# Patient Record
Sex: Male | Born: 1965
Health system: Southern US, Community
[De-identification: ages and names within clinical notes are randomized; demographics above are authoritative.]

---

## 2004-04-11 ENCOUNTER — Emergency Department: Payer: Self-pay | Admitting: Emergency Medicine

## 2004-04-12 ENCOUNTER — Other Ambulatory Visit: Payer: Self-pay

## 2011-11-09 ENCOUNTER — Emergency Department: Payer: Self-pay | Admitting: Emergency Medicine

## 2011-11-09 LAB — CBC
HCT: 47.2 % (ref 40.0–52.0)
MCHC: 33.6 g/dL (ref 32.0–36.0)
MCV: 94 fL (ref 80–100)
Platelet: 300 10*3/uL (ref 150–440)
RBC: 5.03 10*6/uL (ref 4.40–5.90)
WBC: 10.2 10*3/uL (ref 3.8–10.6)

## 2011-11-09 LAB — URINALYSIS, COMPLETE
Bilirubin,UR: NEGATIVE
Blood: NEGATIVE
Glucose,UR: NEGATIVE mg/dL (ref 0–75)
Ketone: NEGATIVE
Ph: 5 (ref 4.5–8.0)
RBC,UR: NONE SEEN /HPF (ref 0–5)
Squamous Epithelial: NONE SEEN

## 2011-11-09 LAB — COMPREHENSIVE METABOLIC PANEL
Alkaline Phosphatase: 102 U/L (ref 50–136)
Anion Gap: 5 — ABNORMAL LOW (ref 7–16)
BUN: 12 mg/dL (ref 7–18)
Bilirubin,Total: 0.3 mg/dL (ref 0.2–1.0)
Calcium, Total: 9.2 mg/dL (ref 8.5–10.1)
Chloride: 104 mmol/L (ref 98–107)
Creatinine: 0.99 mg/dL (ref 0.60–1.30)
EGFR (African American): >60
SGOT(AST): 34 U/L (ref 15–37)

## 2011-11-10 ENCOUNTER — Ambulatory Visit: Payer: Self-pay | Admitting: Internal Medicine

## 2011-11-12 ENCOUNTER — Ambulatory Visit: Payer: Self-pay | Admitting: Oncology

## 2011-11-12 LAB — COMPREHENSIVE METABOLIC PANEL
Albumin: 3.7 g/dL (ref 3.4–5.0)
Alkaline Phosphatase: 79 U/L (ref 50–136)
Anion Gap: 6 — ABNORMAL LOW (ref 7–16)
BUN: 14 mg/dL (ref 7–18)
Bilirubin,Total: 0.4 mg/dL (ref 0.2–1.0)
Calcium, Total: 9 mg/dL (ref 8.5–10.1)
Chloride: 107 mmol/L (ref 98–107)
Co2: 27 mmol/L (ref 21–32)
Creatinine: 1.07 mg/dL (ref 0.60–1.30)
EGFR (Non-African Amer.): >60
Glucose: 127 mg/dL — ABNORMAL HIGH (ref 65–99)
SGOT(AST): 22 U/L (ref 15–37)
SGPT (ALT): 32 U/L
Sodium: 140 mmol/L (ref 136–145)

## 2011-11-12 LAB — CBC CANCER CENTER
Basophil #: 0.1 x10 3/mm (ref 0.0–0.1)
Basophil %: 0.6 %
Eosinophil #: 0 x10 3/mm (ref 0.0–0.7)
HGB: 15.1 g/dL (ref 13.0–18.0)
Lymphocyte #: 1.8 x10 3/mm (ref 1.0–3.6)
Lymphocyte %: 16.8 %
MCHC: 33.9 g/dL (ref 32.0–36.0)
MCV: 94 fL (ref 80–100)
Monocyte #: 0.5 x10 3/mm (ref 0.2–1.0)
Neutrophil #: 8.1 x10 3/mm — ABNORMAL HIGH (ref 1.4–6.5)
Neutrophil %: 77.8 %
RBC: 4.75 10*6/uL (ref 4.40–5.90)
WBC: 10.5 x10 3/mm (ref 3.8–10.6)

## 2011-11-12 LAB — APTT: Activated PTT: 27 secs (ref 23.6–35.9)

## 2011-11-16 ENCOUNTER — Ambulatory Visit: Payer: Self-pay | Admitting: Oncology

## 2011-11-22 ENCOUNTER — Ambulatory Visit: Payer: Self-pay | Admitting: Oncology

## 2011-11-26 ENCOUNTER — Ambulatory Visit: Payer: Self-pay | Admitting: Oncology

## 2011-11-30 LAB — PATHOLOGY REPORT

## 2011-12-02 DIAGNOSIS — Z01810 Encounter for preprocedural cardiovascular examination: Secondary | ICD-10-CM

## 2011-12-07 LAB — CBC
Platelet: 270 10*3/uL (ref 150–440)
RBC: 4.7 10*6/uL (ref 4.40–5.90)
WBC: 15.7 10*3/uL — ABNORMAL HIGH (ref 3.8–10.6)

## 2011-12-07 LAB — COMPREHENSIVE METABOLIC PANEL
BUN: 15 mg/dL (ref 7–18)
Bilirubin,Total: 0.4 mg/dL (ref 0.2–1.0)
Chloride: 106 mmol/L (ref 98–107)
Co2: 24 mmol/L (ref 21–32)
Creatinine: 1.05 mg/dL (ref 0.60–1.30)
EGFR (Non-African Amer.): >60
Glucose: 114 mg/dL — ABNORMAL HIGH (ref 65–99)
SGPT (ALT): 29 U/L

## 2011-12-08 ENCOUNTER — Inpatient Hospital Stay: Payer: Self-pay | Admitting: Internal Medicine

## 2011-12-08 DIAGNOSIS — I517 Cardiomegaly: Secondary | ICD-10-CM

## 2011-12-08 LAB — URINALYSIS, COMPLETE
Bilirubin,UR: NEGATIVE
Ketone: NEGATIVE
Leukocyte Esterase: NEGATIVE
Nitrite: NEGATIVE
Ph: 5 (ref 4.5–8.0)
RBC,UR: <1 /HPF (ref 0–5)
Specific Gravity: 1.036 (ref 1.003–1.030)
WBC UR: <1 /HPF (ref 0–5)

## 2011-12-08 LAB — CK-MB: CK-MB: <0.5 ng/mL — ABNORMAL LOW (ref 0.5–3.6)

## 2011-12-08 LAB — TROPONIN I
Troponin-I: <0.02 ng/mL
Troponin-I: <0.02 ng/mL

## 2011-12-09 LAB — CBC WITH DIFFERENTIAL/PLATELET
Basophil #: 0 10*3/uL (ref 0.0–0.1)
Basophil %: 0.1 %
Eosinophil #: 0 10*3/uL (ref 0.0–0.7)
Eosinophil %: 0 %
HCT: 41.7 % (ref 40.0–52.0)
HGB: 13.8 g/dL (ref 13.0–18.0)
Lymphocyte #: 1.2 10*3/uL (ref 1.0–3.6)
MCH: 31.2 pg (ref 26.0–34.0)
MCHC: 33.1 g/dL (ref 32.0–36.0)
Monocyte #: 1.6 x10 3/mm — ABNORMAL HIGH (ref 0.2–1.0)
Monocyte %: 6 %
Neutrophil %: 89.2 %
RDW: 12.8 % (ref 11.5–14.5)

## 2011-12-09 LAB — COMPREHENSIVE METABOLIC PANEL
Albumin: 2.8 g/dL — ABNORMAL LOW (ref 3.4–5.0)
Anion Gap: 9 (ref 7–16)
BUN: 14 mg/dL (ref 7–18)
Bilirubin,Total: 0.3 mg/dL (ref 0.2–1.0)
Calcium, Total: 9.1 mg/dL (ref 8.5–10.1)
Co2: 25 mmol/L (ref 21–32)
Creatinine: 0.85 mg/dL (ref 0.60–1.30)
EGFR (African American): >60
Osmolality: 282 (ref 275–301)
SGPT (ALT): 19 U/L
Sodium: 140 mmol/L (ref 136–145)
Total Protein: 7.5 g/dL (ref 6.4–8.2)

## 2011-12-09 LAB — FOLATE: Folic Acid: 9.5 ng/mL (ref 3.1–100.0)

## 2011-12-09 LAB — PROTIME-INR
INR: 1
Prothrombin Time: 13.3 secs (ref 11.5–14.7)

## 2011-12-09 LAB — MAGNESIUM: Magnesium: 1.9 mg/dL

## 2011-12-10 LAB — CBC WITH DIFFERENTIAL/PLATELET
Basophil #: 0 10*3/uL (ref 0.0–0.1)
Basophil %: 0.1 %
Eosinophil #: 0 10*3/uL (ref 0.0–0.7)
Eosinophil %: 0 %
HGB: 13.2 g/dL (ref 13.0–18.0)
Lymphocyte #: 1.2 10*3/uL (ref 1.0–3.6)
MCHC: 34.2 g/dL (ref 32.0–36.0)
Monocyte #: 1.8 x10 3/mm — ABNORMAL HIGH (ref 0.2–1.0)
Neutrophil %: 86.6 %
Platelet: 285 10*3/uL (ref 150–440)
RBC: 4.12 10*6/uL — ABNORMAL LOW (ref 4.40–5.90)
RDW: 12.9 % (ref 11.5–14.5)
WBC: 22.9 10*3/uL — ABNORMAL HIGH (ref 3.8–10.6)

## 2011-12-11 ENCOUNTER — Ambulatory Visit: Payer: Self-pay | Admitting: Internal Medicine

## 2011-12-11 ENCOUNTER — Ambulatory Visit: Payer: Self-pay | Admitting: Oncology

## 2011-12-11 LAB — CBC WITH DIFFERENTIAL/PLATELET
Eosinophil #: 0 10*3/uL (ref 0.0–0.7)
Eosinophil %: 0.5 %
HCT: 42.4 % (ref 40.0–52.0)
HGB: 14.4 g/dL (ref 13.0–18.0)
Lymphocyte #: 2.1 10*3/uL (ref 1.0–3.6)
Lymphocyte %: 22.4 %
MCH: 32.1 pg (ref 26.0–34.0)
MCHC: 33.8 g/dL (ref 32.0–36.0)
MCV: 95 fL (ref 80–100)
Monocyte #: 1.1 x10 3/mm — ABNORMAL HIGH (ref 0.2–1.0)
Neutrophil %: 64.7 %
RBC: 4.48 10*6/uL (ref 4.40–5.90)

## 2011-12-13 LAB — CULTURE, BLOOD (SINGLE)

## 2011-12-14 LAB — COMPREHENSIVE METABOLIC PANEL
Alkaline Phosphatase: 198 U/L — ABNORMAL HIGH (ref 50–136)
Anion Gap: 8 (ref 7–16)
Calcium, Total: 9.3 mg/dL (ref 8.5–10.1)
Chloride: 101 mmol/L (ref 98–107)
Co2: 28 mmol/L (ref 21–32)
Creatinine: 1.04 mg/dL (ref 0.60–1.30)
EGFR (African American): >60
Glucose: 120 mg/dL — ABNORMAL HIGH (ref 65–99)
Osmolality: 277 (ref 275–301)
Potassium: 4.5 mmol/L (ref 3.5–5.1)
SGPT (ALT): 43 U/L
Sodium: 137 mmol/L (ref 136–145)

## 2011-12-14 LAB — CBC CANCER CENTER
Eosinophil: 2 %
HGB: 15.5 g/dL (ref 13.0–18.0)
Lymphocytes: 15 %
MCH: 31.4 pg (ref 26.0–34.0)
MCV: 94 fL (ref 80–100)
Platelet: 370 x10 3/mm (ref 150–440)
Segmented Neutrophils: 71 %

## 2011-12-21 LAB — COMPREHENSIVE METABOLIC PANEL
Albumin: 3 g/dL — ABNORMAL LOW (ref 3.4–5.0)
BUN: 18 mg/dL (ref 7–18)
Calcium, Total: 9.2 mg/dL (ref 8.5–10.1)
Chloride: 103 mmol/L (ref 98–107)
Co2: 28 mmol/L (ref 21–32)
Creatinine: 0.94 mg/dL (ref 0.60–1.30)
Glucose: 104 mg/dL — ABNORMAL HIGH (ref 65–99)
Osmolality: 280 (ref 275–301)
SGOT(AST): 19 U/L (ref 15–37)
Sodium: 139 mmol/L (ref 136–145)

## 2011-12-21 LAB — CBC CANCER CENTER
Basophil %: 1 %
HCT: 43.3 % (ref 40.0–52.0)
HGB: 14.5 g/dL (ref 13.0–18.0)
Lymphocyte %: 10.5 %
MCH: 31.2 pg (ref 26.0–34.0)
MCHC: 33.4 g/dL (ref 32.0–36.0)
MCV: 94 fL (ref 80–100)
Monocyte #: 0.6 x10 3/mm (ref 0.2–1.0)
Neutrophil #: 14.7 x10 3/mm — ABNORMAL HIGH (ref 1.4–6.5)
Platelet: 329 x10 3/mm (ref 150–440)
RBC: 4.63 10*6/uL (ref 4.40–5.90)

## 2011-12-27 ENCOUNTER — Ambulatory Visit: Payer: Self-pay

## 2011-12-28 LAB — CBC CANCER CENTER
Basophil #: 0.1 x10 3/mm (ref 0.0–0.1)
Eosinophil #: 0 x10 3/mm (ref 0.0–0.7)
Eosinophil %: 0.5 %
HCT: 41.1 % (ref 40.0–52.0)
Lymphocyte #: 1.8 x10 3/mm (ref 1.0–3.6)
Lymphocyte %: 19.1 %
MCH: 31.5 pg (ref 26.0–34.0)
MCV: 93 fL (ref 80–100)
Monocyte #: 1.5 x10 3/mm — ABNORMAL HIGH (ref 0.2–1.0)
Monocyte %: 15.3 %
Neutrophil #: 6.1 x10 3/mm (ref 1.4–6.5)
Neutrophil %: 63.7 %
Platelet: 270 x10 3/mm (ref 150–440)
RBC: 4.42 10*6/uL (ref 4.40–5.90)
WBC: 9.5 x10 3/mm (ref 3.8–10.6)

## 2011-12-28 LAB — COMPREHENSIVE METABOLIC PANEL
Albumin: 3 g/dL — ABNORMAL LOW (ref 3.4–5.0)
Anion Gap: 6 — ABNORMAL LOW (ref 7–16)
Bilirubin,Total: 0.2 mg/dL (ref 0.2–1.0)
Calcium, Total: 8.8 mg/dL (ref 8.5–10.1)
Co2: 32 mmol/L (ref 21–32)
Creatinine: 1.27 mg/dL (ref 0.60–1.30)
EGFR (African American): >60
EGFR (Non-African Amer.): >60
Glucose: 115 mg/dL — ABNORMAL HIGH (ref 65–99)
Osmolality: 279 (ref 275–301)
Potassium: 4 mmol/L (ref 3.5–5.1)
SGOT(AST): 19 U/L (ref 15–37)
SGPT (ALT): 24 U/L
Sodium: 140 mmol/L (ref 136–145)
Total Protein: 7.6 g/dL (ref 6.4–8.2)

## 2012-01-04 LAB — CBC CANCER CENTER
Basophil %: 1.3 %
Eosinophil #: 0.1 x10 3/mm (ref 0.0–0.7)
HCT: 39.1 % — ABNORMAL LOW (ref 40.0–52.0)
HGB: 13 g/dL (ref 13.0–18.0)
Lymphocyte #: 3 x10 3/mm (ref 1.0–3.6)
Lymphocyte %: 18.4 %
MCH: 31 pg (ref 26.0–34.0)
MCHC: 33.3 g/dL (ref 32.0–36.0)
MCV: 93 fL (ref 80–100)
Monocyte #: 1.7 x10 3/mm — ABNORMAL HIGH (ref 0.2–1.0)
Monocyte %: 10 %
Platelet: 337 x10 3/mm (ref 150–440)
WBC: 16.6 x10 3/mm — ABNORMAL HIGH (ref 3.8–10.6)

## 2012-01-04 LAB — COMPREHENSIVE METABOLIC PANEL
Albumin: 2.9 g/dL — ABNORMAL LOW (ref 3.4–5.0)
BUN: 19 mg/dL — ABNORMAL HIGH (ref 7–18)
Bilirubin,Total: 0.2 mg/dL (ref 0.2–1.0)
Calcium, Total: 8.8 mg/dL (ref 8.5–10.1)
Creatinine: 1.22 mg/dL (ref 0.60–1.30)
EGFR (African American): >60
EGFR (Non-African Amer.): >60
Glucose: 133 mg/dL — ABNORMAL HIGH (ref 65–99)
Osmolality: 285 (ref 275–301)
Potassium: 4.3 mmol/L (ref 3.5–5.1)
Sodium: 141 mmol/L (ref 136–145)
Total Protein: 7.5 g/dL (ref 6.4–8.2)

## 2012-01-10 ENCOUNTER — Ambulatory Visit: Payer: Self-pay | Admitting: Oncology

## 2012-01-11 LAB — CBC CANCER CENTER
Basophil #: 0.3 x10 3/mm — ABNORMAL HIGH (ref 0.0–0.1)
Basophil %: 1.3 %
Eosinophil #: 0.1 x10 3/mm (ref 0.0–0.7)
HCT: 39.8 % — ABNORMAL LOW (ref 40.0–52.0)
HGB: 13.1 g/dL (ref 13.0–18.0)
Lymphocyte %: 13.4 %
MCV: 93 fL (ref 80–100)
Monocyte #: 1 x10 3/mm (ref 0.2–1.0)
Neutrophil %: 80.1 %
Platelet: 350 x10 3/mm (ref 150–440)
RDW: 13.2 % (ref 11.5–14.5)
WBC: 20.9 x10 3/mm — ABNORMAL HIGH (ref 3.8–10.6)

## 2012-01-25 LAB — CBC CANCER CENTER
Basophil #: 0.1 x10 3/mm (ref 0.0–0.1)
Basophil %: 1 %
Eosinophil #: 0.2 x10 3/mm (ref 0.0–0.7)
HCT: 38.4 % — ABNORMAL LOW (ref 40.0–52.0)
HGB: 12.8 g/dL — ABNORMAL LOW (ref 13.0–18.0)
Lymphocyte #: 3 x10 3/mm (ref 1.0–3.6)
MCH: 31 pg (ref 26.0–34.0)
MCHC: 33.3 g/dL (ref 32.0–36.0)
MCV: 93 fL (ref 80–100)
Monocyte #: 1.8 x10 3/mm — ABNORMAL HIGH (ref 0.2–1.0)
Neutrophil #: 9.6 x10 3/mm — ABNORMAL HIGH (ref 1.4–6.5)
RDW: 14.8 % — ABNORMAL HIGH (ref 11.5–14.5)
WBC: 14.8 x10 3/mm — ABNORMAL HIGH (ref 3.8–10.6)

## 2012-01-25 LAB — COMPREHENSIVE METABOLIC PANEL
Anion Gap: 9 (ref 7–16)
Bilirubin,Total: 0.2 mg/dL (ref 0.2–1.0)
Chloride: 104 mmol/L (ref 98–107)
Co2: 26 mmol/L (ref 21–32)
Creatinine: 1.03 mg/dL (ref 0.60–1.30)
Potassium: 4.3 mmol/L (ref 3.5–5.1)
SGPT (ALT): 20 U/L
Total Protein: 7.4 g/dL (ref 6.4–8.2)

## 2012-02-01 LAB — CBC CANCER CENTER
Basophil #: 0.2 x10 3/mm — ABNORMAL HIGH (ref 0.0–0.1)
Eosinophil #: 0.1 x10 3/mm (ref 0.0–0.7)
HCT: 39.3 % — ABNORMAL LOW (ref 40.0–52.0)
Lymphocyte #: 2.7 x10 3/mm (ref 1.0–3.6)
MCHC: 32.3 g/dL (ref 32.0–36.0)
MCV: 93 fL (ref 80–100)
Neutrophil #: 15.4 x10 3/mm — ABNORMAL HIGH (ref 1.4–6.5)
Platelet: 320 x10 3/mm (ref 150–440)
RDW: 14.2 % (ref 11.5–14.5)

## 2012-02-10 ENCOUNTER — Ambulatory Visit: Payer: Self-pay | Admitting: Oncology

## 2012-02-15 LAB — COMPREHENSIVE METABOLIC PANEL
Albumin: 3.2 g/dL — ABNORMAL LOW (ref 3.4–5.0)
Alkaline Phosphatase: 115 U/L (ref 50–136)
Anion Gap: 10 (ref 7–16)
BUN: 16 mg/dL (ref 7–18)
Creatinine: 1.26 mg/dL (ref 0.60–1.30)
Glucose: 108 mg/dL — ABNORMAL HIGH (ref 65–99)
Potassium: 4.4 mmol/L (ref 3.5–5.1)
SGOT(AST): 17 U/L (ref 15–37)
SGPT (ALT): 22 U/L (ref 12–78)
Total Protein: 7.3 g/dL (ref 6.4–8.2)

## 2012-02-15 LAB — CBC CANCER CENTER
Basophil #: 0.2 x10 3/mm — ABNORMAL HIGH (ref 0.0–0.1)
Eosinophil #: 0.3 x10 3/mm (ref 0.0–0.7)
HCT: 37.7 % — ABNORMAL LOW (ref 40.0–52.0)
Lymphocyte #: 2.7 x10 3/mm (ref 1.0–3.6)
Lymphocyte %: 24.1 %
MCH: 32.7 pg (ref 26.0–34.0)
MCHC: 35 g/dL (ref 32.0–36.0)
Monocyte #: 1.3 x10 3/mm — ABNORMAL HIGH (ref 0.2–1.0)
Neutrophil %: 60.7 %
Platelet: 315 x10 3/mm (ref 150–440)
RDW: 16 % — ABNORMAL HIGH (ref 11.5–14.5)

## 2012-02-22 LAB — CBC CANCER CENTER
Basophil #: 0.2 x10 3/mm — ABNORMAL HIGH (ref 0.0–0.1)
Eosinophil #: 0.2 x10 3/mm (ref 0.0–0.7)
Eosinophil %: 1 %
HGB: 12.1 g/dL — ABNORMAL LOW (ref 13.0–18.0)
Lymphocyte #: 2.5 x10 3/mm (ref 1.0–3.6)
Lymphocyte %: 15.1 %
MCH: 31.3 pg (ref 26.0–34.0)
MCHC: 32.8 g/dL (ref 32.0–36.0)
Monocyte #: 0.9 x10 3/mm (ref 0.2–1.0)
Neutrophil %: 77.6 %
Platelet: 215 x10 3/mm (ref 150–440)
RDW: 15.9 % — ABNORMAL HIGH (ref 11.5–14.5)

## 2012-03-12 ENCOUNTER — Ambulatory Visit: Payer: Self-pay | Admitting: Oncology

## 2012-03-14 LAB — CBC CANCER CENTER
Basophil #: 0.3 x10 3/mm — ABNORMAL HIGH (ref 0.0–0.1)
Eosinophil %: 1.5 %
HCT: 40.2 % (ref 40.0–52.0)
Lymphocyte #: 2.4 x10 3/mm (ref 1.0–3.6)
Lymphocyte %: 24.4 %
MCH: 32.7 pg (ref 26.0–34.0)
MCHC: 33.6 g/dL (ref 32.0–36.0)
MCV: 97 fL (ref 80–100)
Monocyte #: 1.5 x10 3/mm — ABNORMAL HIGH (ref 0.2–1.0)
Monocyte %: 15.5 %
Platelet: 344 x10 3/mm (ref 150–440)
RBC: 4.14 10*6/uL — ABNORMAL LOW (ref 4.40–5.90)
RDW: 16.4 % — ABNORMAL HIGH (ref 11.5–14.5)
WBC: 9.9 x10 3/mm (ref 3.8–10.6)

## 2012-03-14 LAB — COMPREHENSIVE METABOLIC PANEL
Anion Gap: 6 — ABNORMAL LOW (ref 7–16)
Calcium, Total: 9.2 mg/dL (ref 8.5–10.1)
Chloride: 102 mmol/L (ref 98–107)
Co2: 30 mmol/L (ref 21–32)
EGFR (African American): >60
Osmolality: 277 (ref 275–301)
Potassium: 4.2 mmol/L (ref 3.5–5.1)
Sodium: 138 mmol/L (ref 136–145)

## 2012-03-21 LAB — COMPREHENSIVE METABOLIC PANEL
Alkaline Phosphatase: 105 U/L (ref 50–136)
Anion Gap: 8 (ref 7–16)
Bilirubin,Total: 0.5 mg/dL (ref 0.2–1.0)
Calcium, Total: 9.2 mg/dL (ref 8.5–10.1)
Chloride: 107 mmol/L (ref 98–107)
Co2: 27 mmol/L (ref 21–32)
Creatinine: 1.18 mg/dL (ref 0.60–1.30)
Osmolality: 283 (ref 275–301)
Potassium: 3.9 mmol/L (ref 3.5–5.1)
Sodium: 142 mmol/L (ref 136–145)
Total Protein: 7.6 g/dL (ref 6.4–8.2)

## 2012-03-21 LAB — CBC CANCER CENTER
Eosinophil #: 0.2 x10 3/mm (ref 0.0–0.7)
Eosinophil %: 2.5 %
HCT: 38.5 % — ABNORMAL LOW (ref 40.0–52.0)
Lymphocyte #: 2.1 x10 3/mm (ref 1.0–3.6)
MCH: 33.6 pg (ref 26.0–34.0)
MCHC: 35.3 g/dL (ref 32.0–36.0)
MCV: 95 fL (ref 80–100)
Monocyte #: 1.2 x10 3/mm — ABNORMAL HIGH (ref 0.2–1.0)
Platelet: 261 x10 3/mm (ref 150–440)
RDW: 15.6 % — ABNORMAL HIGH (ref 11.5–14.5)
WBC: 9.2 x10 3/mm (ref 3.8–10.6)

## 2012-04-06 LAB — BASIC METABOLIC PANEL
BUN: 10 mg/dL (ref 7–18)
Calcium, Total: 8.9 mg/dL (ref 8.5–10.1)
Chloride: 102 mmol/L (ref 98–107)
Co2: 27 mmol/L (ref 21–32)
EGFR (African American): >60
EGFR (Non-African Amer.): >60
Glucose: 96 mg/dL (ref 65–99)
Osmolality: 271 (ref 275–301)
Potassium: 4.6 mmol/L (ref 3.5–5.1)
Sodium: 136 mmol/L (ref 136–145)

## 2012-04-11 ENCOUNTER — Ambulatory Visit: Payer: Self-pay | Admitting: Oncology

## 2012-04-11 LAB — CBC CANCER CENTER
Basophil %: 1.3 %
Eosinophil %: 1.6 %
HCT: 37.5 % — ABNORMAL LOW (ref 40.0–52.0)
HGB: 12.8 g/dL — ABNORMAL LOW (ref 13.0–18.0)
Lymphocyte #: 2.6 x10 3/mm (ref 1.0–3.6)
Lymphocyte %: 29.4 %
MCH: 33.3 pg (ref 26.0–34.0)
MCV: 98 fL (ref 80–100)
Monocyte #: 1.2 x10 3/mm — ABNORMAL HIGH (ref 0.2–1.0)
Neutrophil #: 4.8 x10 3/mm (ref 1.4–6.5)
Neutrophil %: 54.5 %
Platelet: 283 x10 3/mm (ref 150–440)
RBC: 3.83 10*6/uL — ABNORMAL LOW (ref 4.40–5.90)

## 2012-04-11 LAB — COMPREHENSIVE METABOLIC PANEL
Albumin: 3.4 g/dL (ref 3.4–5.0)
Anion Gap: 12 (ref 7–16)
Calcium, Total: 9 mg/dL (ref 8.5–10.1)
EGFR (African American): >60
EGFR (Non-African Amer.): >60
Glucose: 116 mg/dL — ABNORMAL HIGH (ref 65–99)
SGOT(AST): 19 U/L (ref 15–37)
Total Protein: 7.2 g/dL (ref 6.4–8.2)

## 2012-04-22 ENCOUNTER — Emergency Department: Payer: Self-pay | Admitting: Emergency Medicine

## 2012-04-22 LAB — CBC
HCT: 39.2 % — ABNORMAL LOW (ref 40.0–52.0)
HGB: 13.5 g/dL (ref 13.0–18.0)
MCH: 33.4 pg (ref 26.0–34.0)
MCHC: 34.4 g/dL (ref 32.0–36.0)
MCV: 97 fL (ref 80–100)
RBC: 4.04 10*6/uL — ABNORMAL LOW (ref 4.40–5.90)
RDW: 13.5 % (ref 11.5–14.5)
WBC: 7.8 10*3/uL (ref 3.8–10.6)

## 2012-04-23 LAB — BASIC METABOLIC PANEL
BUN: 15 mg/dL (ref 7–18)
Calcium, Total: 8.9 mg/dL (ref 8.5–10.1)
Chloride: 107 mmol/L (ref 98–107)
Co2: 26 mmol/L (ref 21–32)
EGFR (Non-African Amer.): >60
Glucose: 202 mg/dL — ABNORMAL HIGH (ref 65–99)
Osmolality: 286 (ref 275–301)
Potassium: 4.2 mmol/L (ref 3.5–5.1)

## 2012-04-23 LAB — TROPONIN I: Troponin-I: <0.02 ng/mL

## 2012-05-02 LAB — CBC CANCER CENTER
Basophil #: 0.1 10*3/uL
Basophil %: 0.9 %
Eosinophil #: 0.5 10*3/uL
Eosinophil %: 5.6 %
HCT: 39.9 % — ABNORMAL LOW
HGB: 13.1 g/dL
Lymphocyte %: 19.7 %
Lymphs Abs: 1.8 10*3/uL
MCH: 32.7 pg
MCHC: 32.9 g/dL
MCV: 99 fL
Monocyte #: 1 10*3/uL
Monocyte %: 11 %
Neutrophil #: 5.8 10*3/uL
Neutrophil %: 62.8 %
Platelet: 221 10*3/uL
RBC: 4.02 10*6/uL — ABNORMAL LOW
RDW: 13.3 %
WBC: 9.3 10*3/uL

## 2012-05-02 LAB — COMPREHENSIVE METABOLIC PANEL
Albumin: 3 g/dL — ABNORMAL LOW (ref 3.4–5.0)
Alkaline Phosphatase: 125 U/L (ref 50–136)
Bilirubin,Total: 0.3 mg/dL (ref 0.2–1.0)
Calcium, Total: 8.9 mg/dL (ref 8.5–10.1)
Chloride: 104 mmol/L (ref 98–107)
Co2: 24 mmol/L (ref 21–32)
Creatinine: 1.16 mg/dL (ref 0.60–1.30)
EGFR (African American): >60
EGFR (Non-African Amer.): >60
Glucose: 113 mg/dL — ABNORMAL HIGH (ref 65–99)
Osmolality: 280 (ref 275–301)
Sodium: 139 mmol/L (ref 136–145)
Total Protein: 6.9 g/dL (ref 6.4–8.2)

## 2012-05-09 ENCOUNTER — Inpatient Hospital Stay: Payer: Self-pay | Admitting: Surgery

## 2012-05-09 ENCOUNTER — Ambulatory Visit: Payer: Self-pay | Admitting: Oncology

## 2012-05-09 LAB — COMPREHENSIVE METABOLIC PANEL
Albumin: 3.3 g/dL — ABNORMAL LOW (ref 3.4–5.0)
Alkaline Phosphatase: 95 U/L (ref 50–136)
Bilirubin,Total: 0.5 mg/dL (ref 0.2–1.0)
Calcium, Total: 8.9 mg/dL (ref 8.5–10.1)
Chloride: 102 mmol/L (ref 98–107)
Creatinine: 1.2 mg/dL (ref 0.60–1.30)
EGFR (Non-African Amer.): >60
Glucose: 102 mg/dL — ABNORMAL HIGH (ref 65–99)
Osmolality: 276 (ref 275–301)
Sodium: 137 mmol/L (ref 136–145)

## 2012-05-09 LAB — CBC CANCER CENTER
Basophil %: 0.3 %
HCT: 39.9 % — ABNORMAL LOW (ref 40.0–52.0)
Lymphocyte #: 1.3 x10 3/mm (ref 1.0–3.6)
Lymphocyte %: 19.8 %
MCH: 33.2 pg (ref 26.0–34.0)
MCHC: 34.1 g/dL (ref 32.0–36.0)
Monocyte %: 1.4 %
Neutrophil #: 4.6 x10 3/mm (ref 1.4–6.5)
Neutrophil %: 72.1 %
Platelet: 115 x10 3/mm — ABNORMAL LOW (ref 150–440)
RBC: 4.1 10*6/uL — ABNORMAL LOW (ref 4.40–5.90)
RDW: 13.1 % (ref 11.5–14.5)

## 2012-05-09 LAB — LIPASE, BLOOD: Lipase: 117 U/L (ref 73–393)

## 2012-05-10 LAB — CBC WITH DIFFERENTIAL/PLATELET
Basophil #: 0 10*3/uL (ref 0.0–0.1)
Basophil %: 0.7 %
Eosinophil %: 2.6 %
HCT: 34.4 % — ABNORMAL LOW (ref 40.0–52.0)
HGB: 12.4 g/dL — ABNORMAL LOW (ref 13.0–18.0)
Lymphocyte #: 1.3 10*3/uL (ref 1.0–3.6)
MCH: 34 pg (ref 26.0–34.0)
MCHC: 36 g/dL (ref 32.0–36.0)
MCV: 95 fL (ref 80–100)
Monocyte #: 0.2 x10 3/mm (ref 0.2–1.0)
Neutrophil #: 4.8 10*3/uL (ref 1.4–6.5)
Neutrophil %: 74.7 %
RBC: 3.64 10*6/uL — ABNORMAL LOW (ref 4.40–5.90)

## 2012-05-10 LAB — BASIC METABOLIC PANEL
BUN: 16 mg/dL (ref 7–18)
Calcium, Total: 8.6 mg/dL (ref 8.5–10.1)
Co2: 27 mmol/L (ref 21–32)
Creatinine: 0.94 mg/dL (ref 0.60–1.30)
EGFR (African American): >60
EGFR (Non-African Amer.): >60
Glucose: 98 mg/dL (ref 65–99)

## 2012-05-11 LAB — CBC WITH DIFFERENTIAL/PLATELET
Basophil #: 0 10*3/uL (ref 0.0–0.1)
Basophil %: 0.6 %
Eosinophil #: 0 10*3/uL (ref 0.0–0.7)
HCT: 33.4 % — ABNORMAL LOW (ref 40.0–52.0)
HGB: 12.1 g/dL — ABNORMAL LOW (ref 13.0–18.0)
Lymphocyte #: 0.6 10*3/uL — ABNORMAL LOW (ref 1.0–3.6)
Lymphocyte %: 11.6 %
Monocyte #: 0.1 x10 3/mm — ABNORMAL LOW (ref 0.2–1.0)
Monocyte %: 2.5 %
Neutrophil #: 4.3 10*3/uL (ref 1.4–6.5)
Neutrophil %: 84.5 %
Platelet: 83 10*3/uL — ABNORMAL LOW (ref 150–440)
RDW: 12.8 % (ref 11.5–14.5)
WBC: 5.1 10*3/uL (ref 3.8–10.6)

## 2012-05-11 LAB — HEPATIC FUNCTION PANEL A (ARMC)
Albumin: 3.1 g/dL — ABNORMAL LOW (ref 3.4–5.0)
Alkaline Phosphatase: 79 U/L (ref 50–136)
Bilirubin, Direct: 0.1 mg/dL (ref 0.00–0.20)
Bilirubin,Total: 0.3 mg/dL (ref 0.2–1.0)

## 2012-05-12 ENCOUNTER — Ambulatory Visit: Payer: Self-pay | Admitting: Oncology

## 2012-05-12 LAB — CBC WITH DIFFERENTIAL/PLATELET
Basophil %: 0.5 %
Eosinophil #: 0.1 10*3/uL (ref 0.0–0.7)
Lymphocyte %: 12.7 %
MCH: 33 pg (ref 26.0–34.0)
MCHC: 34.6 g/dL (ref 32.0–36.0)
MCV: 95 fL (ref 80–100)
Monocyte #: 0.3 x10 3/mm (ref 0.2–1.0)
Neutrophil %: 81 %
Platelet: 77 10*3/uL — ABNORMAL LOW (ref 150–440)
RDW: 12.7 % (ref 11.5–14.5)

## 2012-05-15 LAB — PATHOLOGY REPORT

## 2012-05-16 LAB — CBC CANCER CENTER
Basophil #: 0 x10 3/mm (ref 0.0–0.1)
Basophil %: 1.4 %
Eosinophil #: 0.1 x10 3/mm (ref 0.0–0.7)
Eosinophil %: 3.5 %
HGB: 11.9 g/dL — ABNORMAL LOW (ref 13.0–18.0)
MCH: 33 pg (ref 26.0–34.0)
Monocyte #: 0.3 x10 3/mm (ref 0.2–1.0)
Monocyte %: 8.8 %
Neutrophil #: 1.7 x10 3/mm (ref 1.4–6.5)
Neutrophil %: 54.6 %
Platelet: 89 x10 3/mm — ABNORMAL LOW (ref 150–440)
RBC: 3.6 10*6/uL — ABNORMAL LOW (ref 4.40–5.90)

## 2012-06-11 ENCOUNTER — Ambulatory Visit: Payer: Self-pay | Admitting: Oncology

## 2012-06-22 LAB — CBC CANCER CENTER
Basophil %: 2.5 %
Eosinophil #: 0.3 x10 3/mm (ref 0.0–0.7)
Eosinophil %: 3.7 %
HCT: 37.9 % — ABNORMAL LOW (ref 40.0–52.0)
HGB: 13.4 g/dL (ref 13.0–18.0)
Lymphocyte %: 13.3 %
MCHC: 35.3 g/dL (ref 32.0–36.0)
Monocyte #: 0.9 x10 3/mm (ref 0.2–1.0)
Monocyte %: 10.7 %
Neutrophil #: 5.8 x10 3/mm (ref 1.4–6.5)
Platelet: 166 x10 3/mm (ref 150–440)
RBC: 3.91 10*6/uL — ABNORMAL LOW (ref 4.40–5.90)

## 2012-06-22 LAB — COMPREHENSIVE METABOLIC PANEL
Alkaline Phosphatase: 324 U/L — ABNORMAL HIGH (ref 50–136)
Chloride: 102 mmol/L (ref 98–107)
Co2: 26 mmol/L (ref 21–32)
Creatinine: 1.15 mg/dL (ref 0.60–1.30)
EGFR (African American): >60
EGFR (Non-African Amer.): >60
Glucose: 98 mg/dL (ref 65–99)
Osmolality: 276 (ref 275–301)
SGOT(AST): 56 U/L — ABNORMAL HIGH (ref 15–37)
SGPT (ALT): 38 U/L (ref 12–78)
Sodium: 138 mmol/L (ref 136–145)

## 2012-06-22 LAB — CREATININE, SERUM
Creatinine: 1.25 mg/dL (ref 0.60–1.30)
EGFR (African American): >60
EGFR (Non-African Amer.): >60

## 2012-06-22 LAB — TROPONIN I: Troponin-I: <0.02 ng/mL

## 2012-07-03 ENCOUNTER — Inpatient Hospital Stay: Payer: Self-pay | Admitting: Oncology

## 2012-07-03 LAB — CBC CANCER CENTER
Basophil #: 0.2 x10 3/mm — ABNORMAL HIGH (ref 0.0–0.1)
Basophil %: 2.2 %
Eosinophil #: 0.2 x10 3/mm (ref 0.0–0.7)
HCT: 37 % — ABNORMAL LOW (ref 40.0–52.0)
HGB: 12.9 g/dL — ABNORMAL LOW (ref 13.0–18.0)
Lymphocyte %: 12.3 %
MCHC: 34.8 g/dL (ref 32.0–36.0)
Monocyte #: 0.9 x10 3/mm (ref 0.2–1.0)
Neutrophil %: 71.9 %
Platelet: 118 x10 3/mm — ABNORMAL LOW (ref 150–440)
RDW: 14.6 % — ABNORMAL HIGH (ref 11.5–14.5)

## 2012-07-03 LAB — COMPREHENSIVE METABOLIC PANEL
Albumin: 3.4 g/dL (ref 3.4–5.0)
Anion Gap: 13 (ref 7–16)
BUN: 11 mg/dL (ref 7–18)
Bilirubin,Total: 0.9 mg/dL (ref 0.2–1.0)
Creatinine: 1.21 mg/dL (ref 0.60–1.30)
EGFR (African American): >60
Glucose: 97 mg/dL (ref 65–99)
Osmolality: 273 (ref 275–301)
Potassium: 4.8 mmol/L (ref 3.5–5.1)
Sodium: 137 mmol/L (ref 136–145)
Total Protein: 7.4 g/dL (ref 6.4–8.2)

## 2012-07-04 LAB — CBC WITH DIFFERENTIAL/PLATELET
Basophil #: 0 10*3/uL (ref 0.0–0.1)
Eosinophil #: 0 10*3/uL (ref 0.0–0.7)
HCT: 33.6 % — ABNORMAL LOW (ref 40.0–52.0)
Lymphocyte %: 6.3 %
MCH: 32.6 pg (ref 26.0–34.0)
MCHC: 33.2 g/dL (ref 32.0–36.0)
MCV: 98 fL (ref 80–100)
Monocyte #: 0.8 x10 3/mm (ref 0.2–1.0)
Neutrophil #: 6.8 10*3/uL — ABNORMAL HIGH (ref 1.4–6.5)
Platelet: 103 10*3/uL — ABNORMAL LOW (ref 150–440)
RDW: 14.4 % (ref 11.5–14.5)
WBC: 8.2 10*3/uL (ref 3.8–10.6)

## 2012-07-04 LAB — BASIC METABOLIC PANEL
Anion Gap: 8 (ref 7–16)
Co2: 24 mmol/L (ref 21–32)
EGFR (African American): >60
EGFR (Non-African Amer.): >60
Osmolality: 276 (ref 275–301)

## 2012-07-05 LAB — CBC WITH DIFFERENTIAL/PLATELET
Basophil %: 0.3 %
HCT: 35 % — ABNORMAL LOW (ref 40.0–52.0)
HGB: 12 g/dL — ABNORMAL LOW (ref 13.0–18.0)
Lymphocyte #: 0.3 10*3/uL — ABNORMAL LOW (ref 1.0–3.6)
Lymphocyte %: 4.7 %
MCH: 33.6 pg (ref 26.0–34.0)
MCHC: 34.3 g/dL (ref 32.0–36.0)
MCV: 98 fL (ref 80–100)
Neutrophil #: 6.7 10*3/uL — ABNORMAL HIGH (ref 1.4–6.5)
RDW: 14.6 % — ABNORMAL HIGH (ref 11.5–14.5)

## 2012-07-06 LAB — CBC WITH DIFFERENTIAL/PLATELET
Basophil #: 0.1 10*3/uL (ref 0.0–0.1)
Eosinophil %: 0 %
HCT: 32.2 % — ABNORMAL LOW (ref 40.0–52.0)
Lymphocyte %: 6.6 %
Monocyte %: 3.9 %
Neutrophil #: 8 10*3/uL — ABNORMAL HIGH (ref 1.4–6.5)
RBC: 3.34 10*6/uL — ABNORMAL LOW (ref 4.40–5.90)
RDW: 14.3 % (ref 11.5–14.5)
WBC: 9 10*3/uL (ref 3.8–10.6)

## 2012-07-07 LAB — CBC WITH DIFFERENTIAL/PLATELET
Basophil #: 0.1 10*3/uL (ref 0.0–0.1)
Basophil %: 1.2 %
HCT: 31.5 % — ABNORMAL LOW (ref 40.0–52.0)
HGB: 10.9 g/dL — ABNORMAL LOW (ref 13.0–18.0)
Lymphocyte %: 11.5 %
Monocyte %: 1.6 %
Neutrophil #: 6.1 10*3/uL (ref 1.4–6.5)
Neutrophil %: 84.3 %
RBC: 3.24 10*6/uL — ABNORMAL LOW (ref 4.40–5.90)
RDW: 14.3 % (ref 11.5–14.5)
WBC: 7.2 10*3/uL (ref 3.8–10.6)

## 2012-07-12 ENCOUNTER — Ambulatory Visit: Payer: Self-pay | Admitting: Oncology

## 2012-07-14 ENCOUNTER — Observation Stay: Payer: Self-pay | Admitting: Oncology

## 2012-07-14 LAB — CBC CANCER CENTER
Basophil #: 0 x10 3/mm (ref 0.0–0.1)
Basophil %: 1.2 %
Eosinophil %: 0.8 %
HCT: 28.1 % — ABNORMAL LOW (ref 40.0–52.0)
HGB: 9.8 g/dL — ABNORMAL LOW (ref 13.0–18.0)
Lymphocyte #: 1.1 x10 3/mm (ref 1.0–3.6)
Lymphocyte %: 31.3 %
MCH: 33.6 pg (ref 26.0–34.0)
MCV: 97 fL (ref 80–100)
Monocyte #: 0.3 x10 3/mm (ref 0.2–1.0)
Neutrophil #: 2 x10 3/mm (ref 1.4–6.5)
Neutrophil %: 58.9 %
Platelet: 7 x10 3/mm — CL (ref 150–440)
RBC: 2.91 10*6/uL — ABNORMAL LOW (ref 4.40–5.90)
RDW: 13.8 % (ref 11.5–14.5)

## 2012-07-15 LAB — CBC WITH DIFFERENTIAL/PLATELET
Basophil #: 0 10*3/uL (ref 0.0–0.1)
Basophil %: 1.2 %
Eosinophil #: 0 10*3/uL (ref 0.0–0.7)
Eosinophil %: 0.2 %
HCT: 28 % — ABNORMAL LOW (ref 40.0–52.0)
HGB: 10.2 g/dL — ABNORMAL LOW (ref 13.0–18.0)
Lymphocyte %: 21.7 %
MCH: 35.3 pg — ABNORMAL HIGH (ref 26.0–34.0)
MCHC: 36.4 g/dL — ABNORMAL HIGH (ref 32.0–36.0)
Monocyte #: 0.1 x10 3/mm — ABNORMAL LOW (ref 0.2–1.0)
Monocyte %: 5.7 %
Neutrophil #: 1.8 10*3/uL (ref 1.4–6.5)
Neutrophil %: 71.2 %
RBC: 2.89 10*6/uL — ABNORMAL LOW (ref 4.40–5.90)
WBC: 2.5 10*3/uL — ABNORMAL LOW (ref 3.8–10.6)

## 2012-07-18 ENCOUNTER — Ambulatory Visit: Payer: Self-pay | Admitting: Oncology

## 2012-07-18 LAB — CBC CANCER CENTER
Basophil #: 0 x10 3/mm (ref 0.0–0.1)
Basophil %: 0.9 %
Eosinophil #: 0 x10 3/mm (ref 0.0–0.7)
Eosinophil %: 0.5 %
HCT: 28.7 % — ABNORMAL LOW (ref 40.0–52.0)
HGB: 9.8 g/dL — ABNORMAL LOW (ref 13.0–18.0)
Lymphocyte %: 18 %
MCHC: 34 g/dL (ref 32.0–36.0)
MCV: 99 fL (ref 80–100)
Monocyte %: 6.7 %
Neutrophil #: 3.6 x10 3/mm (ref 1.4–6.5)
Neutrophil %: 73.9 %
Platelet: 19 x10 3/mm — CL (ref 150–440)
RDW: 14.8 % — ABNORMAL HIGH (ref 11.5–14.5)
WBC: 4.9 x10 3/mm (ref 3.8–10.6)

## 2012-07-20 LAB — COMPREHENSIVE METABOLIC PANEL
Albumin: 3.2 g/dL — ABNORMAL LOW (ref 3.4–5.0)
Anion Gap: 11 (ref 7–16)
Bilirubin,Total: 1 mg/dL (ref 0.2–1.0)
Calcium, Total: 8.8 mg/dL (ref 8.5–10.1)
Chloride: 101 mmol/L (ref 98–107)
Potassium: 4.5 mmol/L (ref 3.5–5.1)
SGOT(AST): 69 U/L — ABNORMAL HIGH (ref 15–37)
Total Protein: 7 g/dL (ref 6.4–8.2)

## 2012-07-20 LAB — CBC CANCER CENTER
Eosinophil #: 0 x10 3/mm (ref 0.0–0.7)
Lymphocyte #: 0.9 x10 3/mm — ABNORMAL LOW (ref 1.0–3.6)
MCH: 34.7 pg — ABNORMAL HIGH (ref 26.0–34.0)
MCV: 99 fL (ref 80–100)
Neutrophil %: 72.2 %
Platelet: 39 x10 3/mm — ABNORMAL LOW (ref 150–440)
RDW: 15.4 % — ABNORMAL HIGH (ref 11.5–14.5)

## 2012-07-27 LAB — CBC CANCER CENTER
Basophil %: 1.1 %
Eosinophil %: 0.3 %
HCT: 30.2 % — ABNORMAL LOW (ref 40.0–52.0)
HGB: 10.5 g/dL — ABNORMAL LOW (ref 13.0–18.0)
Lymphocyte #: 1 x10 3/mm (ref 1.0–3.6)
Lymphocyte %: 12.5 %
MCH: 34.7 pg — ABNORMAL HIGH (ref 26.0–34.0)
MCHC: 34.7 g/dL (ref 32.0–36.0)
Monocyte #: 1.1 x10 3/mm — ABNORMAL HIGH (ref 0.2–1.0)
Neutrophil #: 5.4 x10 3/mm (ref 1.4–6.5)
Neutrophil %: 71.3 %
Platelet: 93 x10 3/mm — ABNORMAL LOW (ref 150–440)
RDW: 17.9 % — ABNORMAL HIGH (ref 11.5–14.5)
WBC: 7.6 x10 3/mm (ref 3.8–10.6)

## 2012-08-02 ENCOUNTER — Emergency Department: Payer: Self-pay | Admitting: Emergency Medicine

## 2012-08-02 LAB — COMPREHENSIVE METABOLIC PANEL
Albumin: 3.1 g/dL — ABNORMAL LOW (ref 3.4–5.0)
Alkaline Phosphatase: 605 U/L — ABNORMAL HIGH (ref 50–136)
BUN: 18 mg/dL (ref 7–18)
Calcium, Total: 9.5 mg/dL (ref 8.5–10.1)
Chloride: 101 mmol/L (ref 98–107)
Creatinine: 1.09 mg/dL (ref 0.60–1.30)
Potassium: 4.2 mmol/L (ref 3.5–5.1)
SGOT(AST): 145 U/L — ABNORMAL HIGH (ref 15–37)
Total Protein: 7.2 g/dL (ref 6.4–8.2)

## 2012-08-02 LAB — TROPONIN I: Troponin-I: <0.02 ng/mL

## 2012-08-02 LAB — CBC
HCT: 31.7 % — ABNORMAL LOW (ref 40.0–52.0)
MCH: 33.7 pg (ref 26.0–34.0)
MCHC: 33.6 g/dL (ref 32.0–36.0)
MCV: 100 fL (ref 80–100)
Platelet: 188 10*3/uL (ref 150–440)
RBC: 3.16 10*6/uL — ABNORMAL LOW (ref 4.40–5.90)
RDW: 17.9 % — ABNORMAL HIGH (ref 11.5–14.5)
WBC: 10 10*3/uL (ref 3.8–10.6)

## 2012-08-02 LAB — PRO B NATRIURETIC PEPTIDE: B-Type Natriuretic Peptide: 934 pg/mL — ABNORMAL HIGH (ref 0–125)

## 2012-08-02 LAB — CK TOTAL AND CKMB (NOT AT ARMC): CK, Total: 174 U/L (ref 35–232)

## 2012-08-03 ENCOUNTER — Emergency Department: Payer: Self-pay | Admitting: Emergency Medicine

## 2012-08-03 LAB — COMPREHENSIVE METABOLIC PANEL
BUN: 18 mg/dL (ref 7–18)
Bilirubin,Total: 2.1 mg/dL — ABNORMAL HIGH (ref 0.2–1.0)
Chloride: 98 mmol/L (ref 98–107)
Co2: 26 mmol/L (ref 21–32)
EGFR (Non-African Amer.): >60
Glucose: 102 mg/dL — ABNORMAL HIGH (ref 65–99)
Osmolality: 268 (ref 275–301)
Potassium: 4.3 mmol/L (ref 3.5–5.1)
SGOT(AST): 182 U/L — ABNORMAL HIGH (ref 15–37)
SGPT (ALT): 99 U/L — ABNORMAL HIGH (ref 12–78)
Total Protein: 7.4 g/dL (ref 6.4–8.2)

## 2012-08-03 LAB — CBC
HCT: 32.6 % — ABNORMAL LOW (ref 40.0–52.0)
HGB: 11.1 g/dL — ABNORMAL LOW (ref 13.0–18.0)
MCV: 100 fL (ref 80–100)
Platelet: 210 10*3/uL (ref 150–440)
RBC: 3.25 10*6/uL — ABNORMAL LOW (ref 4.40–5.90)
RDW: 17.5 % — ABNORMAL HIGH (ref 11.5–14.5)

## 2012-08-03 LAB — LIPASE, BLOOD: Lipase: 134 U/L (ref 73–393)

## 2012-08-06 LAB — URINALYSIS, COMPLETE
Bilirubin,UR: NEGATIVE
Glucose,UR: NEGATIVE mg/dL (ref 0–75)
Nitrite: NEGATIVE
Protein: NEGATIVE
RBC,UR: <1 /HPF (ref 0–5)
Specific Gravity: 1.016 (ref 1.003–1.030)
Squamous Epithelial: NONE SEEN
WBC UR: 1 /HPF (ref 0–5)

## 2012-08-06 LAB — COMPREHENSIVE METABOLIC PANEL
Albumin: 3 g/dL — ABNORMAL LOW (ref 3.4–5.0)
Alkaline Phosphatase: 666 U/L — ABNORMAL HIGH (ref 50–136)
BUN: 22 mg/dL — ABNORMAL HIGH (ref 7–18)
Bilirubin,Total: 1.7 mg/dL — ABNORMAL HIGH (ref 0.2–1.0)
Calcium, Total: 9.5 mg/dL (ref 8.5–10.1)
Chloride: 96 mmol/L — ABNORMAL LOW (ref 98–107)
Creatinine: 0.91 mg/dL (ref 0.60–1.30)
EGFR (African American): >60
EGFR (Non-African Amer.): >60
Osmolality: 269 (ref 275–301)
Sodium: 133 mmol/L — ABNORMAL LOW (ref 136–145)
Total Protein: 7.1 g/dL (ref 6.4–8.2)

## 2012-08-06 LAB — CBC
HCT: 32.7 % — ABNORMAL LOW (ref 40.0–52.0)
HGB: 10.9 g/dL — ABNORMAL LOW (ref 13.0–18.0)
MCH: 33 pg (ref 26.0–34.0)
MCV: 100 fL (ref 80–100)
Platelet: 240 10*3/uL (ref 150–440)
RBC: 3.29 10*6/uL — ABNORMAL LOW (ref 4.40–5.90)
RDW: 16.7 % — ABNORMAL HIGH (ref 11.5–14.5)
WBC: 14.4 10*3/uL — ABNORMAL HIGH (ref 3.8–10.6)

## 2012-08-06 LAB — PROTIME-INR
INR: 1
Prothrombin Time: 13.6 secs (ref 11.5–14.7)

## 2012-08-07 ENCOUNTER — Inpatient Hospital Stay: Payer: Self-pay | Admitting: Surgery

## 2012-08-12 ENCOUNTER — Observation Stay: Payer: Self-pay | Admitting: Student

## 2012-08-12 ENCOUNTER — Ambulatory Visit: Payer: Self-pay | Admitting: Oncology

## 2012-08-12 LAB — COMPREHENSIVE METABOLIC PANEL
Albumin: 2.9 g/dL — ABNORMAL LOW (ref 3.4–5.0)
Alkaline Phosphatase: 944 U/L — ABNORMAL HIGH (ref 50–136)
Chloride: 100 mmol/L (ref 98–107)
Creatinine: 0.85 mg/dL (ref 0.60–1.30)
EGFR (African American): >60
Glucose: 93 mg/dL (ref 65–99)
Potassium: 4.4 mmol/L (ref 3.5–5.1)
SGOT(AST): 321 U/L — ABNORMAL HIGH (ref 15–37)
SGPT (ALT): 194 U/L — ABNORMAL HIGH (ref 12–78)
Sodium: 134 mmol/L — ABNORMAL LOW (ref 136–145)
Total Protein: 6.6 g/dL (ref 6.4–8.2)

## 2012-08-12 LAB — URINALYSIS, COMPLETE
Bacteria: NONE SEEN
Bilirubin,UR: NEGATIVE
Glucose,UR: NEGATIVE mg/dL (ref 0–75)
Ketone: NEGATIVE
Leukocyte Esterase: NEGATIVE
Nitrite: NEGATIVE
Ph: 5 (ref 4.5–8.0)
Squamous Epithelial: NONE SEEN
WBC UR: <1 /HPF (ref 0–5)

## 2012-08-12 LAB — CBC
MCH: 33 pg (ref 26.0–34.0)
MCV: 99 fL (ref 80–100)
RDW: 17.5 % — ABNORMAL HIGH (ref 11.5–14.5)
WBC: 15.5 10*3/uL — ABNORMAL HIGH (ref 3.8–10.6)

## 2012-08-12 LAB — DIFFERENTIAL
Basophil #: 0 10*3/uL (ref 0.0–0.1)
Basophil %: 0.3 %
Eosinophil #: 0 10*3/uL (ref 0.0–0.7)
Eosinophil %: 0.1 %
Monocyte #: 1.3 x10 3/mm — ABNORMAL HIGH (ref 0.2–1.0)
Monocyte %: 8.1 %
Neutrophil %: 86.7 %

## 2012-08-12 LAB — CULTURE, BLOOD (SINGLE)

## 2012-08-13 LAB — BASIC METABOLIC PANEL
Anion Gap: 10 (ref 7–16)
Calcium, Total: 9.6 mg/dL (ref 8.5–10.1)
Chloride: 102 mmol/L (ref 98–107)
Co2: 24 mmol/L (ref 21–32)
EGFR (African American): >60
EGFR (Non-African Amer.): >60
Glucose: 84 mg/dL (ref 65–99)
Potassium: 4.2 mmol/L (ref 3.5–5.1)
Sodium: 136 mmol/L (ref 136–145)

## 2012-08-13 LAB — CBC WITH DIFFERENTIAL/PLATELET
Eosinophil: 1 %
HCT: 29.5 % — ABNORMAL LOW (ref 40.0–52.0)
Lymphocytes: 5 %
MCH: 33.2 pg (ref 26.0–34.0)
MCHC: 33.1 g/dL (ref 32.0–36.0)
MCV: 100 fL (ref 80–100)
Metamyelocyte: 2 %
Monocytes: 10 %
RBC: 2.94 10*6/uL — ABNORMAL LOW (ref 4.40–5.90)
RDW: 17.5 % — ABNORMAL HIGH (ref 11.5–14.5)
Segmented Neutrophils: 72 %
Variant Lymphocyte - H1-Rlymph: 1 %
WBC: 13.3 10*3/uL — ABNORMAL HIGH (ref 3.8–10.6)

## 2012-08-15 ENCOUNTER — Ambulatory Visit: Payer: Self-pay | Admitting: Oncology

## 2012-09-09 ENCOUNTER — Ambulatory Visit: Payer: Self-pay | Admitting: Oncology

## 2012-09-09 DEATH — deceased

## 2013-04-03 IMAGING — CT CT CHEST-ABD-PELV W/ CM
1 of 2 series · 13 of 32 positions shown, 18 images · IV contrast (APPLIED)
Comparison: None

REASON FOR EXAM: pleuritic cp sob in ca pt
COMMENTS:

PROCEDURE:     CT  - CT PE CHEST/ ABD/PELVIS WITH  - August 12, 2012  [DATE]
RESULT:      CT CHEST, ABDOMEN, AND PELVIS
HISTORY: Pleuritic chest pain
TECHNIQUE: Multiple axial images obtained from the thoracic inlet to the
pubic symphysis, with p.o. contrast and with 100 ml of Bsovue-94Y
intravenous contrast.  A thin-section spiral CT from the lung apices to the
upper abdomen was acquired on a multi slice scanner following 100ml
Bsovue-94Y intravenous contrast. These images were then transferred to the
[employer] work station and were subsequently reviewed utilizing 3-D
reconstructions and MIP images.

[Series 8: abdomen · axial · 0.83mm/px · z∈[-914,-437]mm · 13 of 179 slices shown, 18 images]
[im 10/179  soft-tissue]
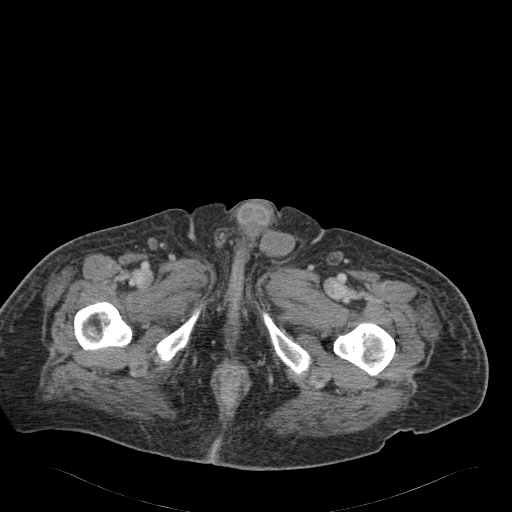
[im 10/179  bone]
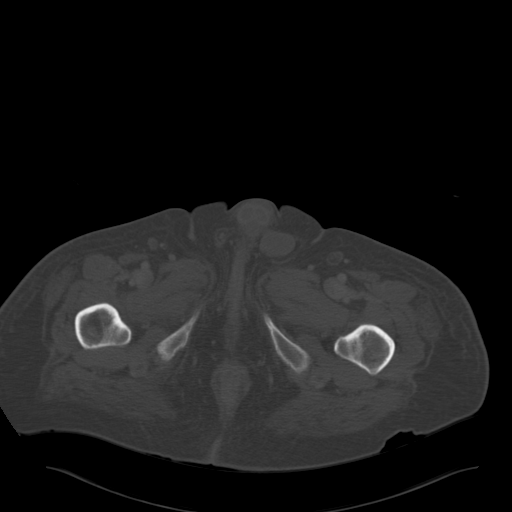
[im 29/179  soft-tissue]
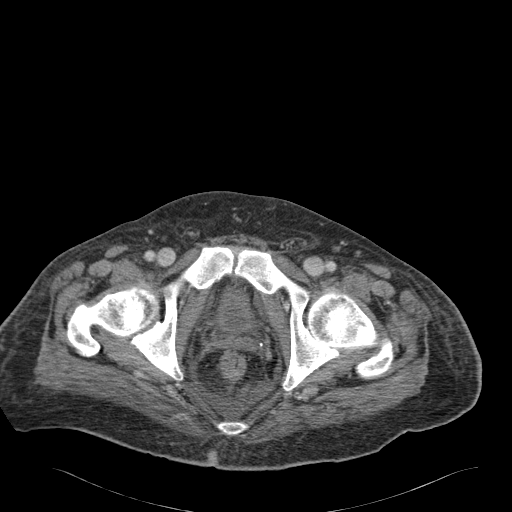
[im 38/179  soft-tissue]
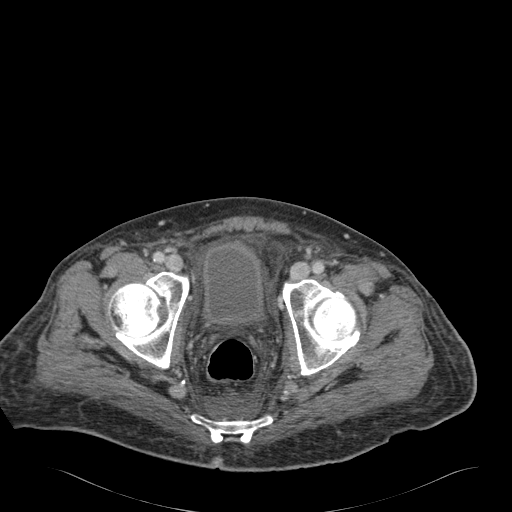
[im 57/179  soft-tissue]
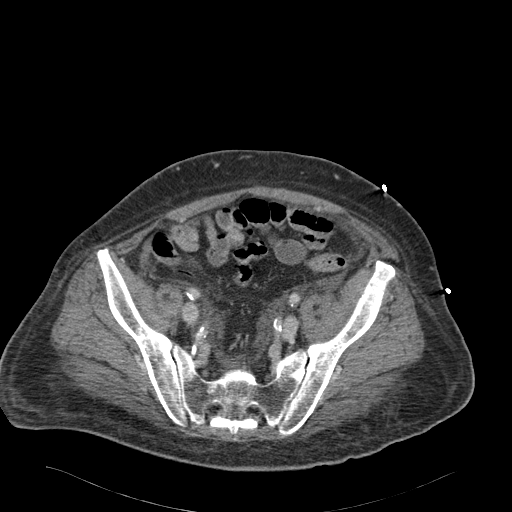
[im 66/179  soft-tissue]
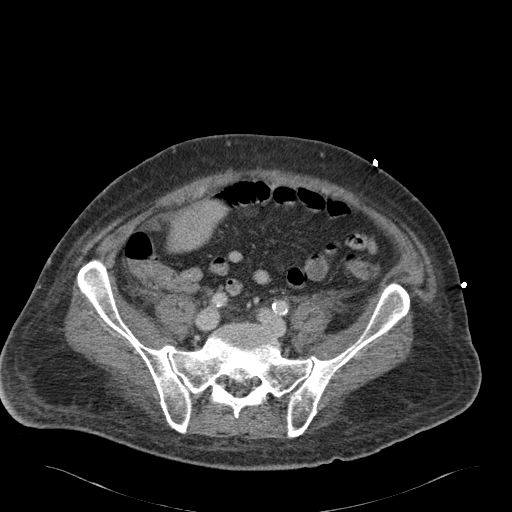
[im 85/179  soft-tissue]
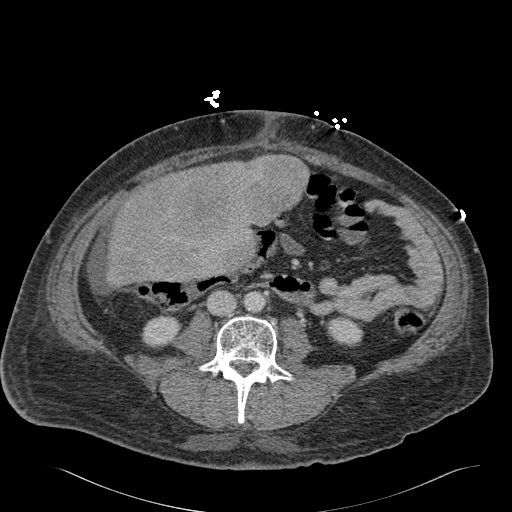
[im 94/179  soft-tissue]
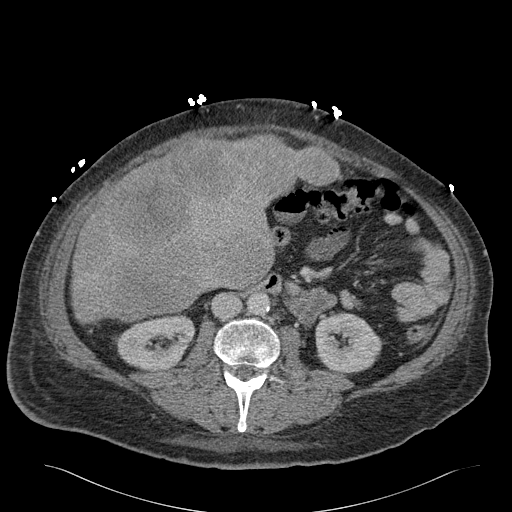
[im 113/179  soft-tissue]
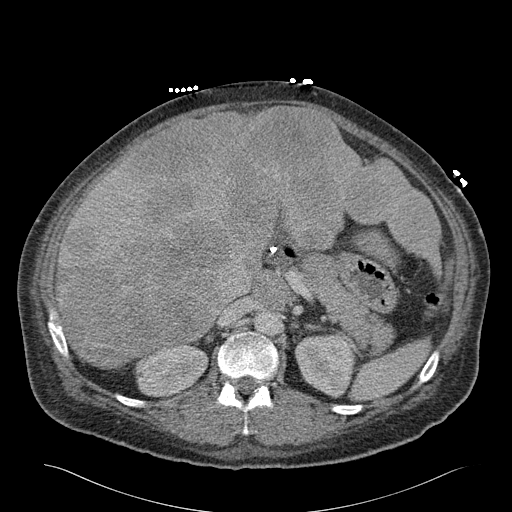
[im 122/179  soft-tissue]
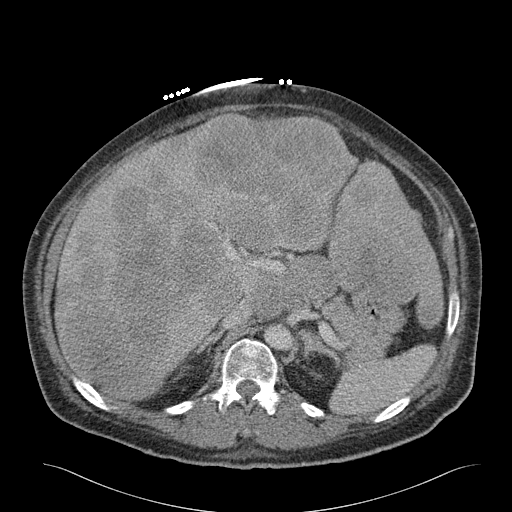
[im 122/179  bone]
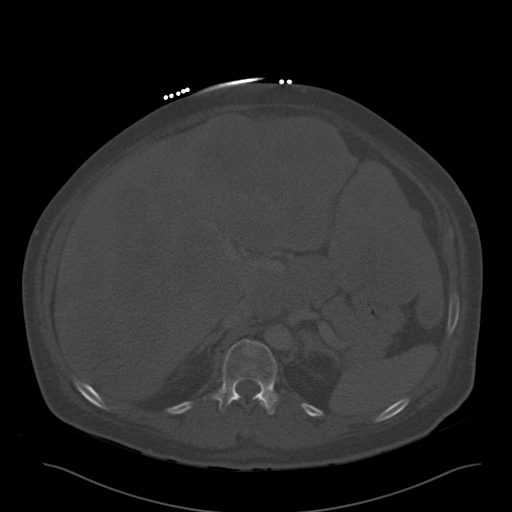
[im 141/179  soft-tissue]
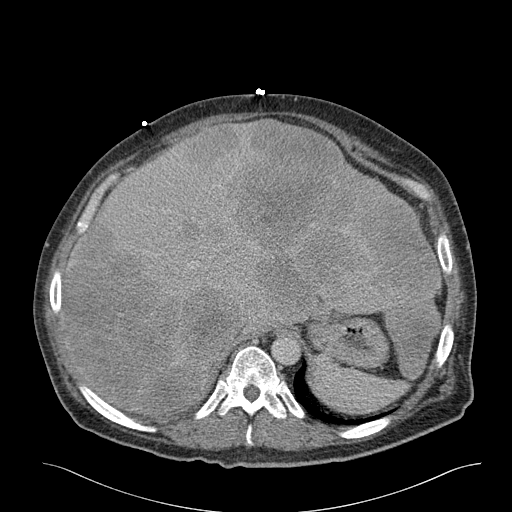
[im 141/179  lung]
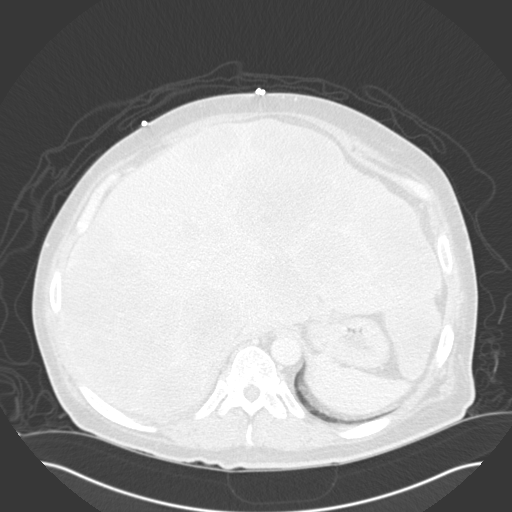
[im 150/179  soft-tissue]
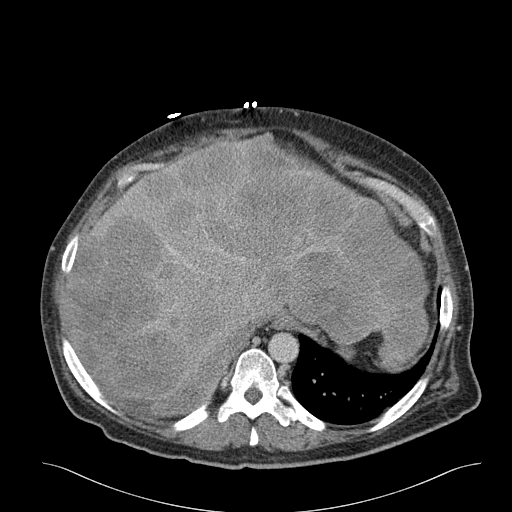
[im 150/179  lung]
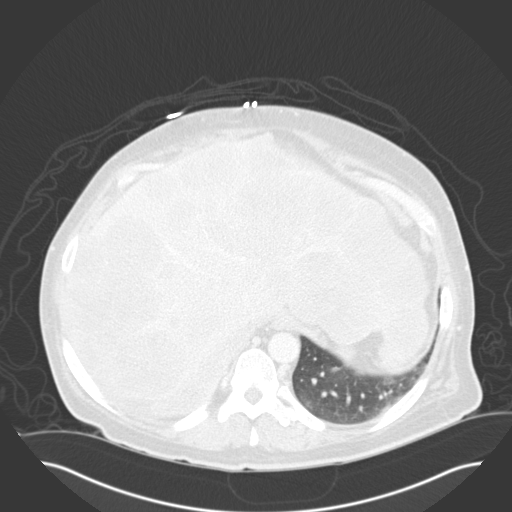
[im 160/179  lung]
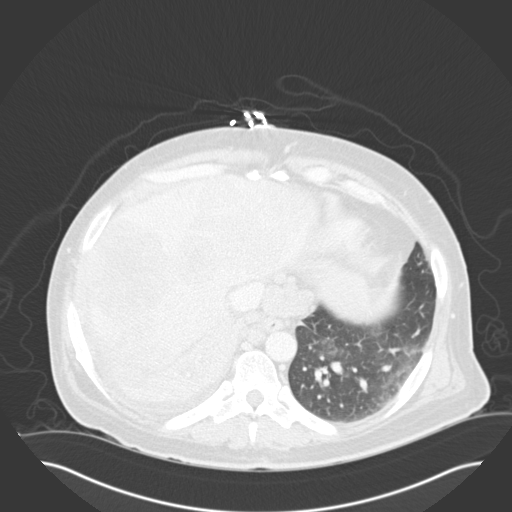
[im 169/179  soft-tissue]
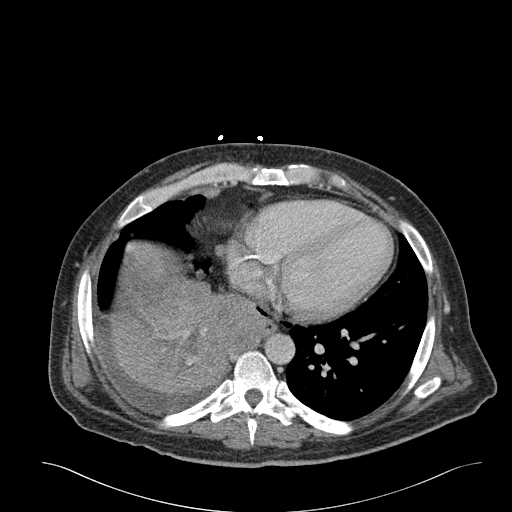
[im 169/179  lung]
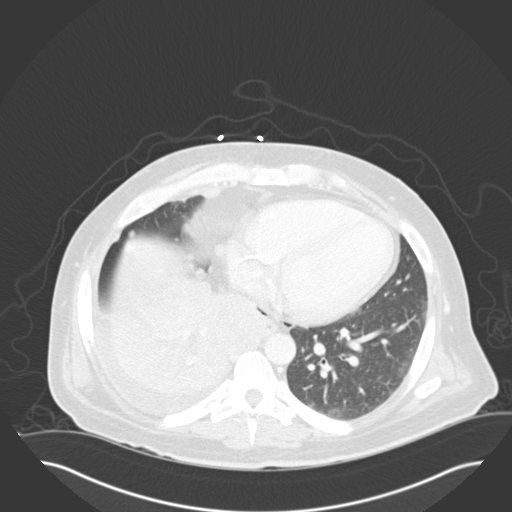

[13 of 32 positions shown; findings below may reference images not displayed]

FINDINGS: CHEST:

There is a large right perihilar mass again noted measuring at least 8 x
cm with mediastinal extension an large right paratracheal lymph nodes again
noted. There is postobstructive atelectasis. There is a small right pleural
effusion. There is a pleural based soft tissue mass measuring 2.2 x 3.4 cm
along the right lateral chest wall. There multiple other pleural-based
masses in the right lung.

There is adequate opacification of the pulmonary arteries. There is no
pulmonary embolus. The main pulmonary artery, right main pulmonary artery,
and left main pulmonary arteries are normal in size. The heart size is
normal. There is no pericardial effusion. The right lower lobe pulmonary
artery is severely narrowed by a right perihilar mass. There is coronary
artery atherosclerosis.

There are no pathologically enlarged mediastinal, hilar, or axillary lymph
nodes.

The osseous structures demonstrate no focal abnormality.

ABDOMEN/PELVIS:

The liver is massively enlarged with innumerable no masses throughout the
liver consistent with metastatic disease. There are and large portal lymph
nodes. There is a small amount of fluid in the paracolic gutters and in the
pelvis. There is no intrahepatic or extrahepatic biliary ductal dilatation.
The gallbladder is unremarkable. The spleen demonstrates no focal
abnormality. The kidneys, adrenal glands, pancreas are normal. The bladder
is decompressed with relative bladder wall thickening and there is
pericystic hazy inflammatory changes.

The stomach, duodenum, small intestine, and large intestine demonstrate no
contrast extravasation or dilatation. There is no pneumoperitoneum,
pneumatosis, or portal venous gas. There is no abdominal or pelvic free
fluid. There is no lymphadenopathy.

The abdominal aorta is normal in caliber .

The osseous structures are unremarkable.
IMPRESSION: 1. Again noted is a large right perihilar mass and mediastinal
lymphadenopathy consistent with malignancy. There are pleural-based
metastatic deposits in the right chest. The liver is massively enlarged with
innumerable masses throughout the liver consistent with metastatic disease.

2. There is no CT evidence of a pulmonary embolus. The right pulmonary
artery is severely narrowed by the right perihilar mass.

3.  The bladder is decompressed with relative bladder wall thickening and
there is pericystic hazy inflammatory changes. This appearance can be seen
with cystitis. Correlate with urinalysis.

[REDACTED] a

## 2013-04-03 IMAGING — CR DG CHEST 2V
1 series · 2 of 2 positions shown · non-contrast
Comparison: none

REASON FOR EXAM: SOB
COMMENTS:

PROCEDURE:     DXR - DXR CHEST PA (OR AP) AND LATERAL  - August 12, 2012  [DATE]
RESULT:     Comparison: 08/02/2012

[Series 1: x chest ap · 0.14mm/px · 2 of 2 slices shown]
[im 1/2]
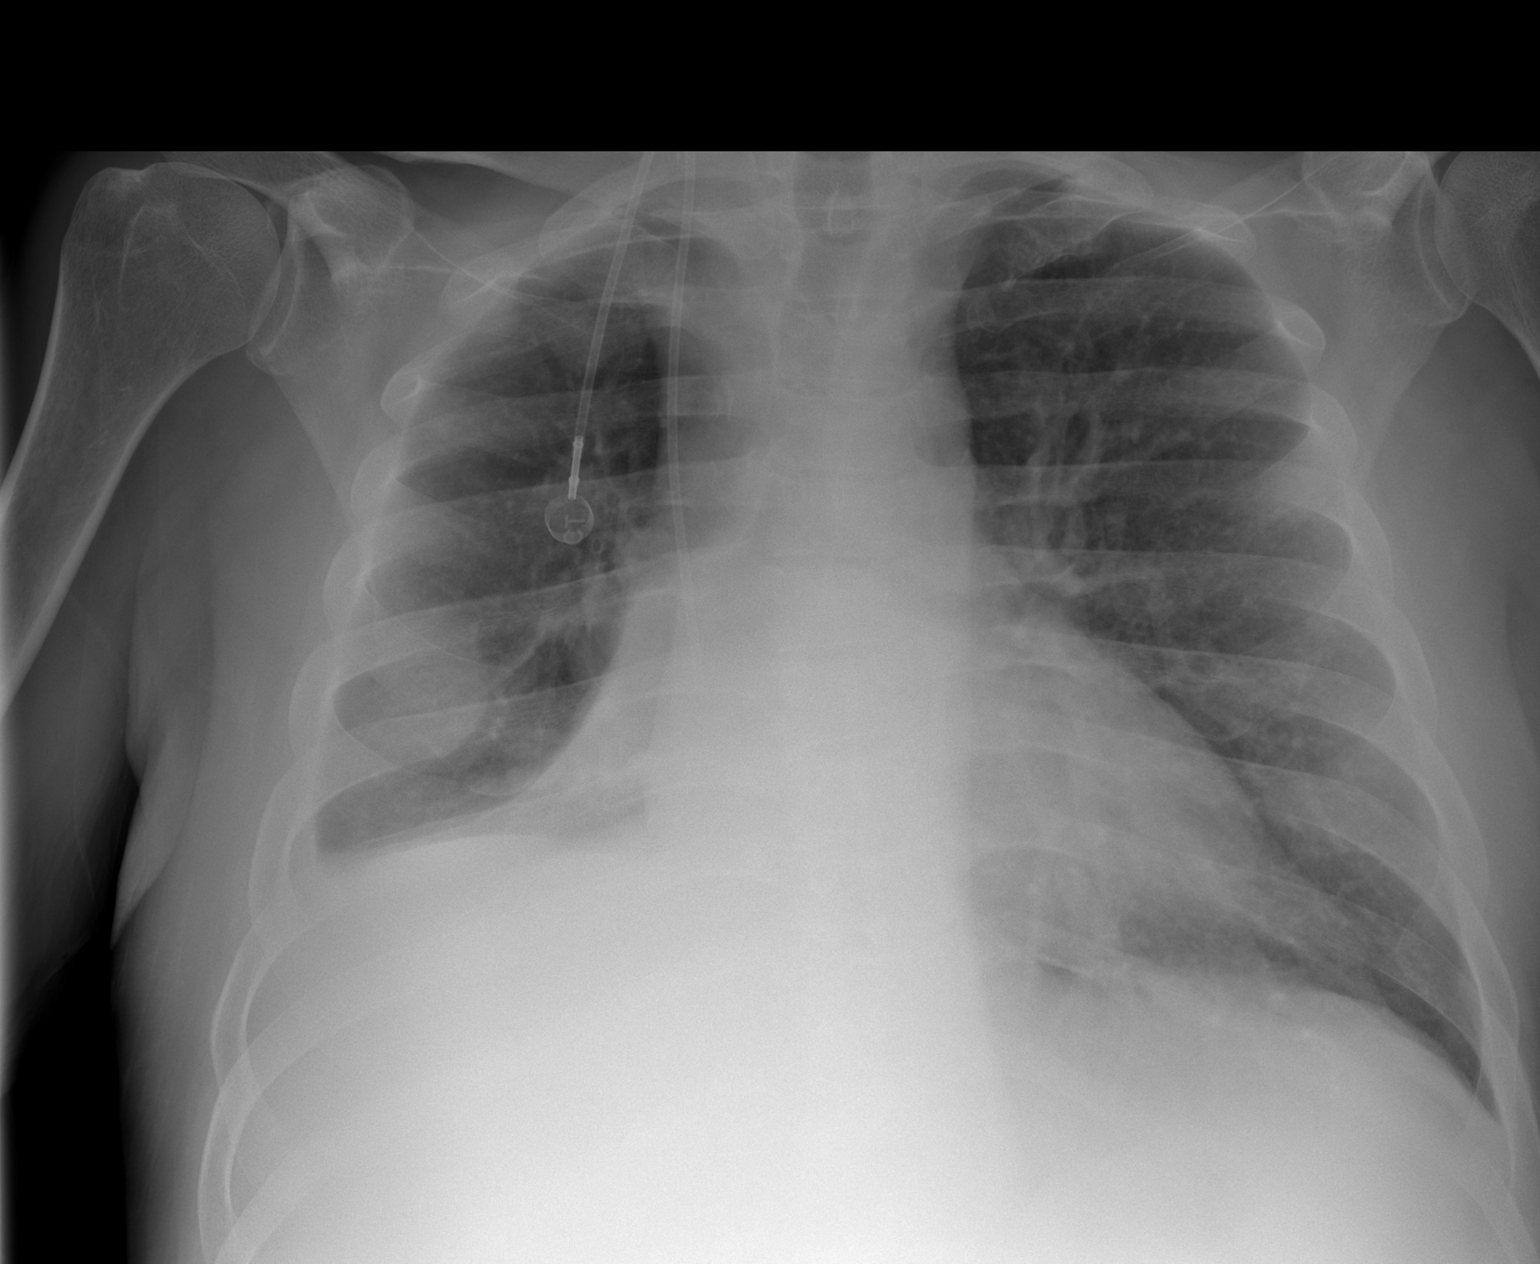
[im 2/2]
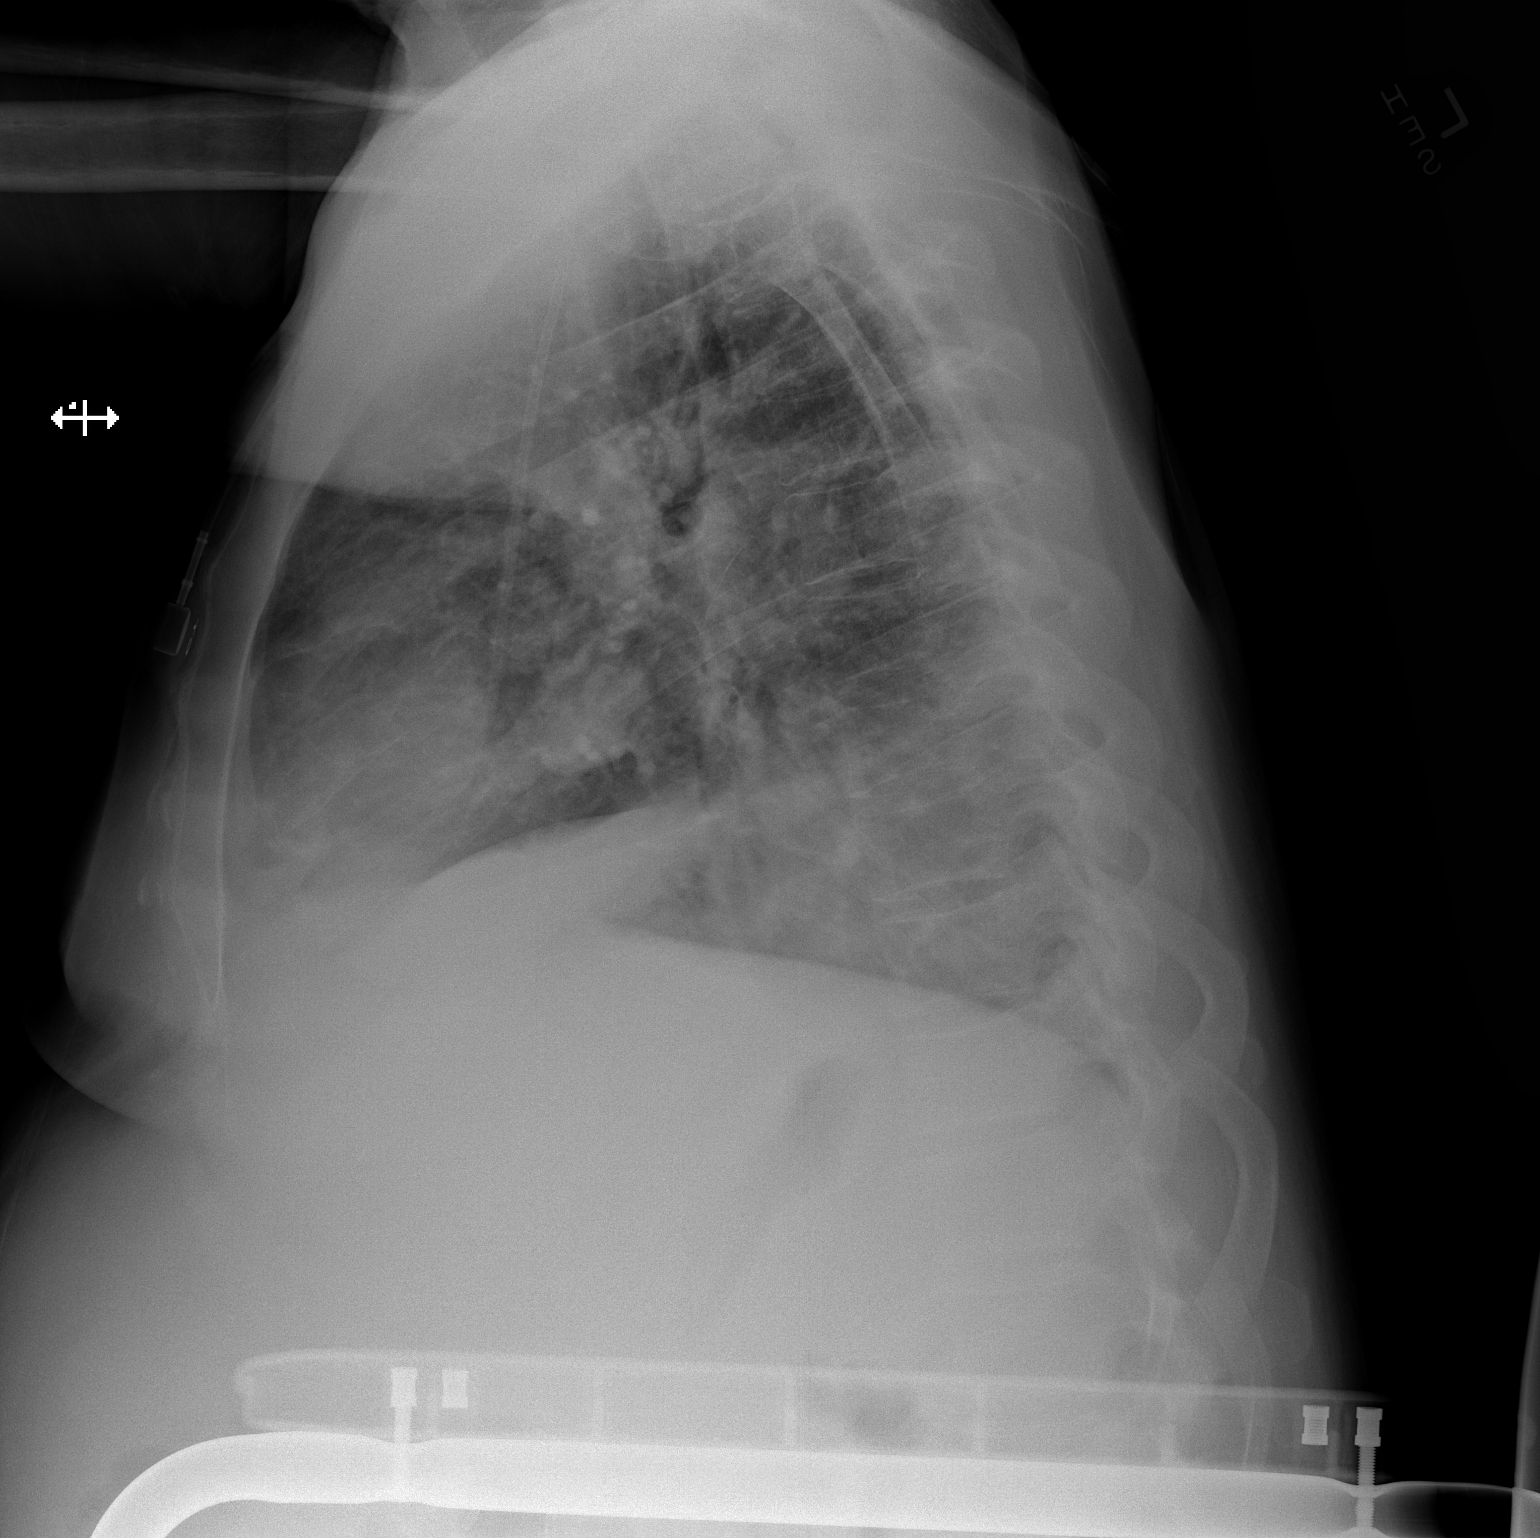

[2 of 2 positions shown; findings below may reference images not displayed]

FINDINGS: PA and lateral chest radiographs are provided. There is a small right
pleural effusion. There is a right perihilar mass. The left lung is clear.
There is no pneumothorax. The heart and mediastinum are unremarkable.  The
osseous structures are unremarkable.
IMPRESSION: Small right pleural effusion and right perihilar mass consistent with the
patient's known history of malignancy.

[REDACTED]

## 2014-10-29 NOTE — Consult Note (Signed)
Comments   At pt's request I spoke with his step-mother, Jamicah Anstead [phone]). Pt gave me consent to discuss his medical care. She called pt's room after being contacted by pt's cousin after my phone call. She says that pt has alienated himself from the entire family. He apparently has told them that he has cancer but family did not believe him. Pt's father is estranged. Phineas Semen doubts that there will be any member of the family that will support patient through this ordeal.  that I again met with pt who says that he did complete his advanced directive paperwork during his last hospitalization. He says that he named his mother as his 22. Unfortunately, pt's mother seems unreachable. Pt is unable to give me an alternative as to who would help him with medical decisions. He is also undecided as to code status. These are issues that will need to be readressed.   Will follow.  30 minutes  Electronic Signatures: Remo Kirschenmann, Kirt Boys (NP)  (Signed 23-Dec-13 16:20)  Authored: Palliative Care   Last Updated: 23-Dec-13 16:20 by Irean Hong (NP)

## 2014-10-29 NOTE — H&P (Signed)
PATIENT NAME:  Larry Vincent, Larry Vincent MR#:  [id number] DATE OF BIRTH:  1965-11-13  DATE OF ADMISSION:  05/09/2012  REASON FOR ADMISSION: Evaluate epigastric pain with ultrasound with stone in gallbladder neck.   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 He recently underwent chemotherapy which ended on Friday and he began having epigastric pain that has lasted since then. It is associated with nausea and vomiting. He says usually he does have some nausea and vomiting with his chemotherapy but the pain is not the regular pain he has. He has had this pain before and usually lasts only two days. He has not been able to keep much down since Friday. He also reports that he has had a history of diverticulitis but this feels unlike his diverticulitis pain. Has never had a colonoscopy after that. He reports that he has been told to have his gallbladder removed in the past for similar pain but has never underwent gallbladder surgery. He has no fevers, chills, night sweats, shortness of breath, chest pain, or cough. Is having regular bowel movements per him. No diarrhea. No dysuria or hematuria.   PAST MEDICAL HISTORY:  1. 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  2. History of seizure.  3. History of brain surgery as a child.  4. Hypertension.  5. Chronic obstructive pulmonary disease.  6. Bipolar.  7. Depression.  8. Anxiety.   MEDICATIONS PER CHART:  1. Vicodin ES 7.5/300 one tab p.o. q.6 hours p.r.n. pain.  2. Tegretol 100 mg p.o. q.a.m., 50 mg p.o. q.p.m.  3. Seroquel 100 mg p.o. daily.  4. Oxycodone 10 mg 1 to 2 tabs p.o. q.6 hours p.r.n. pain.  5. Zofran 8 mg p.o. b.i.d.  6. Omeprazole 1 p.o. daily. 7. Lansoprazole 30 mg p.o. daily.  8. Colace 100 mg p.o. b.i.d.  9. Valium 5 mg p.o. t.i.d. p.r.n.  anxiety.  10. Dexamethasone 4 mg p.o. b.i.d.  11. Compazine 10 mg p.o. q.6 hours p.r.n. nausea and vomiting.  12. Celexa 20 mg p.o. daily. 13. Albuterol 2.5/3 mL one dose inhaled every four hours p.r.n.  14. Vicodin as needed p.r.n.   ALLERGIES: Lisinopril.   SOCIAL HISTORY: Reports history of tobacco use, now smokes five cigarettes a day. Alcohol social. Larry Vincent he does smoke marijuana. Lives in [place] with a friend who he lives with.   FAMILY HISTORY: Has two sisters who have died with AIDS and suicide. Mother has coronary artery disease and diabetes.   REVIEW OF SYSTEMS: 12 point review of systems was obtained. Pertinent positives and negatives are as above.   PHYSICAL EXAMINATION:   VITAL SIGNS: Temperature 97.9, pulse 61, blood pressure 118/78, respirations 22, 96%.   GENERAL: Alert and oriented x3. No acute distress.   HEAD: Normocephalic, atraumatic.   EYES: No jaundice. No scleral icterus.   FACE: No obvious masses or lesions.   CHEST: Lungs clear to auscultation, moving air well.   HEART: Regular rate and rhythm. No murmurs, rubs, or gallops.   ABDOMEN: Soft, nontender, nondistended. No obvious surgical scars.   EXTREMITIES: Moves all extremities well. Strength 5 out of 5.   LABORATORY, DIAGNOSTIC, AND RADIOLOGICAL DATA: White cell count 6.4 with 72% neutrophils, hemoglobin 13.6, hematocrit 39.9, platelets are 115. LFTs are normal. Renal panel normal except for creatinine  of 1.2.   Ultrasound shows a 1.8 cm stone at the neck of the gallbladder. No pericholecystic fluid. No obvious gallbladder wall thickening or bile duct dilatation.   ASSESSMENT AND PLAN: Larry Vincent is a pleasant 49 year old gentleman with metastatic pancreatic cancer who his epigastric pain and a large stone in the neck of the gallbladder. His symptoms could be consistent with symptomatic cholelithiasis. May also be component of cholecystitis as he is immunosuppressed. Will admit for pain control  and discuss possible cholecystectomy. Will talk with Dr. Orlie DakinFinnegan regarding timing as he is not far from chemotherapy and will discuss with Dr. Michela PitcherEly.  I have discussed this plan with Larry Vincent and he is in agreement with it.    ____________________________ Si Raiderhristopher A. Obryan Radu, MD cal:drc D: 05/09/2012 21:50:52 ET T: 05/10/2012 06:21:42 ET JOB#: [id number]  cc: Cristal Deerhristopher A. Andrienne Havener, MD, <Dictator> Jarvis NewcomerHRISTOPHER A Teyonna Plaisted MD ELECTRONICALLY SIGNED 05/10/2012 20:00

## 2014-10-29 NOTE — Consult Note (Signed)
Brief Consult Note: Diagnosis: Major depressive disorder recurent, anxiety d/o NOS.   Patient was seen by consultant.   Consult note dictated.   Recommend further assessment or treatment.   Orders entered.   Comments: [name] has a h/o depression and anxiety. He also has reccurent small cell carcinoma for which he started another round of chemtherapy. He tolerates treatment well. Depression and anxiety better with Celexa, BuSpar and Xanax. Sleep has improved with increased dose of Seroquel. The patient ios being discharged today.  PLAN: 1. Depression/Anxiety: please continue Celexa for depression, BuSpar and Xanax for anxiety and Seroquel 300 mf for depression/anxiety and sleep.  Electronic Signatures: Kristine LineaPucilowska, Antion Andres (MD)  (Signed 27-Dec-13 13:51)  Authored: Brief Consult Note   Last Updated: 27-Dec-13 13:51 by Kristine LineaPucilowska, [place] Zaragoza (MD)

## 2014-10-29 NOTE — Consult Note (Signed)
Brief Consult Note: Diagnosis: Major depressive disorder recurent, anxiety d/o NOS.   Patient was seen by consultant.   Consult note dictated.   Recommend further assessment or treatment.   Orders entered.   Comments: [name] has a h/o depression and anxiety. He also has reccurent small cell carcinoma for which he starts another round obf chemo today.   PLAN: 1. Depression: please continue Celexa and Seroquel. I will add Buspar that has been helpful in the past.   2. Anxiety: aparently he was on Valium 5 mg tid in the communitty. Here on prn iv ativan and prn 0.25 mg xanax. I see no harm ghiving him valiums back un less there are concerns about his breathing.   3. Social: the patient considers assisted living arrangment long term but prefers to stay at home with his service dog for now.   4. Palliative  care consult is most appropriate..  Electronic Signatures: Kristine LineaPucilowska, Misha Vanoverbeke (MD)  (Signed 23-Dec-13 15:00)  Authored: Brief Consult Note   Last Updated: 23-Dec-13 15:00 by Kristine LineaPucilowska, Zita Ozimek (MD)

## 2014-10-29 NOTE — Discharge Summary (Signed)
PATIENT NAME:  Larry Vincent, Larry Vincent MR#:  [id number] DATE OF BIRTH:  [birth year]  DATE OF ADMISSION:  05/09/2012 DATE OF DISCHARGE:  05/12/2012  BRIEF HISTORY/HOSPITAL COURSE: Larry Vincent is a 49 year old gentleman admitted to the hospital with evidence of biliary tract disease. He has a complicated medical history including significant stage IV lung cancer currently under therapy. He had abdominal symptoms consistent with possible biliary tract disease. Admission laboratory values revealed normal electrolytes, normal liver function studies, slightly depressed hematocrit, and a platelet count of 120,000. He has recently been taking his chemotherapy. He underwent a HIDA scan the following day which demonstrated evidence of gallbladder obstruction consistent with acute cholecystitis. We waited for his platelet count to improve, but it appeared to have reached a nadir. It bottomed out at 83,000. After discussion with the patient about possible bleeding complications and with his significant abdominal discomfort, we elected to proceed with surgical intervention. He underwent laparoscopic cholecystectomy on 05/11/2012. The procedure was uncomplicated, no significant intraoperative problems. Postoperative he had some mild bleeding from one of his incisions which was controlled with suture placement. This morning he is up, active and tolerating a diet with no complaints. His wounds look good. There is no sign of any infection.   DISCHARGE MEDICATIONS:  1. Oxycodone 10 to 20 mg p.o. every 6 hours p.r.n. 2. Albuterol one dose inhaler every four hours p.r.n.  3. Diazepam 5 mg three times daily p.r.n.  4. Vicodin 325/5 mg 1 to 2 every six hours p.r.n.  5. Seroquel 100 mg at bedtime.  6. Celexa 20 mg p.o. daily.  7. Compazine 10 mg p.o. every 6 hours p.r.n. 8. Tegretol 100 mg p.o. every a.m. and 50 mg p.o. every p.m.  9. Dexamethasone 4 mg twice a day.  10. Ventolin inhaler one inhaled dose 4 four times  daily.      FINAL DISCHARGE DIAGNOSES:  1. Cholecystitis, cholelithiasis.  2. Stage IV lung carcinoma.   SURGERY: Laparoscopic cholecystectomy.  ____________________________ Carmie Endalph L. Ely III, MD rle:slb D: 05/12/2012 14:59:07 ET T: 05/13/2012 11:29:40 ET JOB#: [id number]  cc: Carmie Endalph L. Ely III, MD, <Dictator> [name]. Orlie DakinFinnegan, MD Quentin OreALPH L ELY MD ELECTRONICALLY SIGNED 05/17/2012 10:14

## 2014-10-29 NOTE — Op Note (Signed)
PATIENT NAME:  Larry Vincent, Larry Vincent MR#:  [id number] DATE OF BIRTH:  [birth year]  DATE OF PROCEDURE:  05/11/2012  PREOPERATIVE DIAGNOSIS: Chronic cholecystitis and cholelithiasis.   POSTOPERATIVE DIAGNOSIS: Chronic cholecystitis and cholelithiasis.   PROCEDURE: Laparoscopic cholecystectomy.   SURGEON: Quentin Orealph L. Ely, III, MD   ANESTHESIA: General.   OPERATIVE PROCEDURE: With the patient in the supine position after the induction of appropriate general anesthesia, the patient's abdomen was prepped with ChloraPrep and draped with sterile towels. The patient was placed in the head down, feet up position. A small infraumbilical incision was made in the standard fashion, carried down bluntly through subcutaneous tissue. The Veress needle was used to cannulate the peritoneal cavity. CO2 was insufflated to appropriate pressure measurements. When approximately 2.5 liters of CO2 were instilled, the Veress needle was withdrawn. An 11 mm [employer] port was inserted into the peritoneal cavity. Intraperitoneal position was confirmed. CO2 was reinsufflated. The patient was placed in the head up, feet down position and rotated slightly to the left side. A subxiphoid transverse incision was made and an 11 mm port inserted under direct vision. Two lateral ports 5 mm in size were inserted under direct vision. Multiple liver nodules were identified which was consistent with the patient's known metastatic carcinoma. Pictures were taken of the liver. The gallbladder was significantly distended. It was aspirated using a needle aspirator of about 60 mL of clear bile for identifying hydrops of the gallbladder. A large stone was identified in the cystic duct. The gallbladder was retracted superiorly and laterally exposing the hepatoduodenal ligament. The cystic artery and cystic duct were identified. The cystic duct was quite small, so no attempt was made at cholangiography after identifying the common bile duct. The duct was  doubly clipped on both sides and divided. The cystic artery was doubly clipped and divided. The gallbladder was then dissected free from its bed and delivered using the cautery apparatus. Once the gallbladder was free, the gallbladder was removed through the upper abdominal port using the EndoCatch apparatus. The area was copiously suctioned and irrigated. The upper midline fascia was closed with figure-of-eight suture of 0 Vicryl using the needle passer. The abdomen was then desufflated and all ports withdrawn without difficulty. Skin incisions were closed with 5-0 nylon. The area was infiltrated with 0.25% Marcaine for postoperative pain control. Sterile dressings were applied. The patient was returned to the recovery room having tolerated the procedure well. Sponge, instrument, and needle counts were correct x2 in the Operating Room.  ____________________________ Carmie Endalph L. Ely III, MD rle:cbb D: 05/11/2012 14:58:44 ET T: 05/11/2012 15:10:39 ET JOB#: [id number]  cc: Carmie Endalph L. Ely III, MD, <Dictator> [name]. Orlie DakinFinnegan, MD Quentin OreALPH L ELY MD ELECTRONICALLY SIGNED 05/12/2012 18:15

## 2014-11-01 NOTE — Discharge Summary (Signed)
PATIENT NAME:  Larry Vincent, Larry Vincent MR#:  [id number] DATE OF BIRTH:  Jun 11, 1966  DATE OF ADMISSION:  08/12/2012 DATE OF DISCHARGE:  08/14/2012  CONSULTANTS: Ivin BootyJoshua Borders from Palliative Care and Dr. Margarita RanaSurya Challa from Psychiatry   CHIEF COMPLAINT: Intractable nausea, vomiting, abdominal pain.   DISCHARGE DIAGNOSES: 1. Metastatic lung cancer with widespread metastasis, including the liver.  2. Intractable nausea and vomiting.  3. Hypertension.  4. Anxiety.  5. Depression.  6. Bipolar.  7. History of alcohol abuse.   DISPOSITION: The patient will be getting discharged to a hospice home for palliative care.   DISCHARGE MEDICATIONS: Compazine 10 mg every 6 hours as needed for nausea and vomiting, Seroquel 100 mg daily at bedtime, dexamethasone 4 mg 2 times a day, docusate sodium 100 mg 2 times a day, furosemide 20 mg once a day, Albuterol nebs every 4 hours as needed, diazepam 5 mg 3 times a day as needed for anxiety and nervousness, ondansetron  8 mg 1 tab 2 times a day as needed for nausea and vomiting, Tegretol 200 mg tab 0.5 mg once a day in the morning and 0.25 mg every evening, Ventolin HFA 2 puffs 19 mcg per inhaled aerosol 4 times a day as needed for shortness of breath, ambien  10 mg at bedtime as needed for sleep, Celexa 40 mg daily, Tylenol p.r.n., morphine extended release 100 mg 12-hour oral tablet 1 tab every 12 hours, morphine 20 mg/ mL oral concentrate 0.5 mL to 1 mL orally every 1 to 2 hours as needed for pain or dyspnea   HOSPITAL COURSE: For full details of H and P, please see the dictation on February 1st by Dr. Mordecai MaesSanchez, but briefly this is a 49 year old gentleman with progressive stage IV small cell carcinoma of the lung with metastases to the liver, vertebra, with worsening cancer and enlargement of the liver who presents for the above chief complaint. The patient has been having some falls and uncontrolled pain and is unable to take care of himself at home and was admitted to the  Hospitalist service. He has had poor p.o. intake and uncontrolled pain. He was admitted to the hospital for symptomatic management. He was started on some gentle IV fluids, Dilaudid, and his outpatient medications were continued, including the fentanyl. Of note, he was just discharged from the hospital several days ago for a perirectal abscess which was drained on 08/07/2012. He did have some mild dehydration with mild hyponatremia. He was started on Dilaudid p.r.n. for breakthrough pain, and Palliative Care was consulted, and ultimately the patient decided that he wanted to be DNR and [facility]; and, therefore, he will be discharged there once a bed is available later today. While he was here, he was seen by Psychiatry. He understands that his prognosis is very poor.   CODE STATUS:  DNR.    TOTAL TIME SPENT: 32 minutes.   ____________________________ Krystal EatonShayiq Rasheen Bells, MD sa:cb D: 08/14/2012 14:21:17 ET T: 08/14/2012 15:08:02 ET JOB#: [id number]  cc: Krystal EatonShayiq Skylur Fuston, MD, <Dictator> Krystal EatonSHAYIQ Daniela Siebers MD ELECTRONICALLY SIGNED 08/17/2012 81:1920:08

## 2014-11-01 NOTE — H&P (Signed)
PATIENT NAME:  Larry Vincent, Larry Vincent MR#:  [id number] DATE OF BIRTH:  1965-08-26  DATE OF ADMISSION:  08/12/2012  REFERRING PHYSICIAN:  Dorothea GlassmanPaul Malinda, MD   PRIMARY CARE PHYSICIAN:   Gerarda Fractionimothy Finnegan, MD for oncology.  REASON FOR ADMISSION: Severe pain, intractable nausea and vomiting.   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 A new CT shows worsening of the cancer with now an enlargement of the liver, now a mass on the subcutaneous tissue that is related to metastases and overall worsening condition. The patient has been recently admitted over here in January and discharged on January 29 after being seen for perirectal abscess and incision and drainage done 08/07/2012. The patient went home and he is complaining of intractable nausea and vomiting, unable to move unable to get around, unable to eat. He is able to drink very small amount of fluids. He is in significant pain.  His oral medications are not taking care of the pain for what he needs to be admitted.   The patient is severely depressed, crying, very withdrawn with severe depression due to his condition. There is no active suicidal ideation. The patient comes here today. He has had more than 10 episodes of nausea and vomiting as he says.  He continues to cry, continues to tell us, "please don't send me home cuz I can't deal with this over there".  We had a long discussion about the possibility of doing palliative care and started hospice. The patient says that he is open to do that. He is not quite sure if he is going to go to hospice completely if he just wants his pain to be better and then continue with chemotherapy but so far, he is going to be a full code.  He is complaining of increase of abdominal girth, increased swelling, increased abdominal pain and increased size of the that he has on  the subcutaneous tissue and the liver.   REVIEW OF SYSTEMS:  CONSTITUTIONAL:  The patient has had significant weight loss of 60 to 70 pounds in a year. Positive fever 2 days ago. Positive fatigue. Positive weakness.  EYES: He states occasionally he has blurry vision. No redness or pain in his eyes.  ENT:  Difficulty swallowing, difficulty keeping things down.  No tinnitus. No ear pain.   RESPIRATORY: Positive cough. Positive shortness of breath. Positive occasional hemoptysis. Positive nasal bleed, bleeding, positive bleeding through gums.  Occasional painful respirations.  CARDIOVASCULAR: Positive pleuritic chest pain. Positive obtundation. Positive edema.  Positive  palpitations. Negative syncope.  GASTROINTESTINAL: Positive nausea. Positive vomiting. Positive severe constipation. Positive abdominal pain. No melena. No hematemesis.  Negative jaundice. Positive GERD GENITOURINARY: No dysuria or hematuria.  ENDOCRINE: No polyuria, polydipsia or polyphagia. No cold or heat intolerance. HEMATOLOGIC/LYMPHATIC:  Positive anemia. Positive easy bruising. Positive bleeding through gums, nasal cavity and denies any swollen glands.  SKIN: Very dry skin. No rashes.  MUSCULOSKELETAL: Positive pain in neck, back shoulders, hips, everywhere, every joint hurts. No gout. He states that his back is the place that hurts the most.  NEUROLOGIC: No numbness or tingling. No CVAs. No TIAs.  PSYCHIATRIC: Severe depression and severe anxiety.   PAST MEDICAL HISTORY: 1.  Metastatic lung cancer stage IV. 2.  Metastasis to the liver.  3.  Hypertension.  4.  Anxiety.  5.  Depression.  6.  Bipolar disorder.  7.  Tobacco abuse.  8.  Alcohol abuse.   ALLERGIES:  LISINOPRIL.  CURRENT MEDICATIONS: MS Contin 100 mg per mouth twice daily, albuterol p.r.n., Celexa 100 mg once a day, Compazine 10 mg every 6 hours p.r.n., dexamethasone 4 mg twice daily, Valium 5 mg every 8 hours, Dilaudid 2 mg every 4 hours p.r.n. pain,  Colace 100 mg twice daily, Lasix 20 mg once daily, Zofran 8 mg twice daily for nausea, Seroquel 100 mg once today, Tegretol 100 mg in the morning and 50 mg in the evening, Ventolin HFA, Vicodin extra strength 7.5/325 mg p.r.n. pain.   PAST SURGICAL HISTORY:  Cholecystectomy.   FAMILY HISTORY: Negative for cancer. Negative for coronary artery disease.   SOCIAL HISTORY: The patient smokes ongoing 1 pack a day. He drinks occasionally, but he has not drank in the last several weeks. He lives by himself. Still not under hospice care, although it has been mentioned on previous H and P's that he has been on it.   PHYSICAL EXAMINATION: VITAL SIGNS: Blood pressure 111/69, pulse 97, respiratory 20, temperature 96.1, oxygen saturation 96% on room air.  GENERAL: The patient is alert. He is oriented x 3. He is severely debilitated, edematous, with sunken eyes, critically ill-looking, very dehydrated lying in the bed, crying easily, very labile affect.  HEENT: His mucosa are dry. Pupils are equal and reactive. Extraocular movements are intact. Anicteric sclerae. Pink conjunctivae. Very pale otherwise.  His conjunctivae look injected.  No oral lesions. No oropharyngeal exudates. No thrush.  NECK: Supple. No JVD. No thyromegaly. No adenopathy. No carotid bruits. No masses.  CARDIOVASCULAR: Regular rate and rhythm, tachycardic. No rubs or gallops.  Systolic ejection murmur 1/6. No displacement of PMI.  LUNGS: Positive diffuse rhonchi and crackles. His right lung has dullness of percussion, middle lobe and decreased respiratory sounds in that area.  ABDOMEN: Distended, ascitic with mass in the epigastric area, which is very tender to palpation, solid.  His liver is enlarged.  Bowel sounds are decreased, very tender to palpation with guarding but no rebound.  GENITAL: Negative for external lesions.  EXTREMITIES: Positive edema +2. No cyanosis, no clubbing.  SKIN: Without any rashes. PERIANAL REGION:  There is no  significant erythema. There is some Band-Aids that need to be changed.  At this moment, the patient requests not to be bothered with that. I am not going to open it for now.  NEUROLOGIC: Cranial nerves II through XII are intact. No focal findings.  PSYCHIATRIC: Mood:  As mentioned above, labile affect with crying and significant depression. The patient withdrawn, very depressed.  LYMPHATICS: Negative for lymphadenopathy in neck or supraclavicular areas.  MUSCULOSKELETAL: No significant joint deformity or joint effusions.  LABORATORY AND DIAGNOSTIC DATA:  Glucose 93, BUN 30, creatinine 0.85, sodium 134, potassium 4.4, alkaline phosphatase 944, AST of 221, ALT of 194.  All this is elevated from previous tests.  His bilirubin is 1.3, albumin is 2.9. His Tegretol level is 3.7. His white count 15,000 but it has been elevated due to his abscess. His hemoglobin is 10, hematocrit 30, platelets 218. INR is 1.  Would culture of perianal abscess shows bacteroides vulgatus.  UA  within normal limits.   ASSESSMENT AND PLAN: A 49 year old gentleman with bad stage IV, likely terminal cancer of the lung, non-small cell carcinoma, followed by Dr. Orlie Dakin. The patient was admitted for comfort care initially.  At this moment he is not quite sure if he wants to go full hospice, but he  does not have any structure home. He had intractable pain and intractable nausea and vomiting. His medications are not working, for what he is going to be admitted.  Hospice and palliative care consult is going to be obtained.  The patient is a full code for now.  We are going to add on fentanyl patch and treat with IV Dilaudid for now. Hospice is going to see him tomorrow. Overall the patient looks really bad.  He is in bad condition, dehydrated and we are going to give him IV fluids, try to get his pain medication under better control. I do not think there is any septic process going on right now for what I am just going to continue his current  medications,  trimethoprim-sulfamethoxazole for the infection in his perianal area. His liver function tests are worse due to his metastases.  He is not in any acute renal failure. He has hypoalbuminemia, multiple medical problems that are related to his cancer. We will continue Lasix for his edema. Continue dexamethasone for the swelling of the liver and metastatic disease.  For his depression, continue Celexa. We will obtain a psychiatric evaluation to help with his bipolar disorder. The patient is going to be admitted as an observation for now.  The patient is a full code right now.   Deep vein thrombosis prophylaxis only going to be with mechanical devices, SCDs and TED hoses.  Gastrointestinal prophylaxis with a proton pump inhibitor,  TIME SPENT: I spent about 45 minutes with this patient.   ____________________________ Felipa Furnace, MD rsg:ct D: 08/12/2012 21:33:04 ET T: 08/13/2012 14:30:17 ET JOB#: [id number]  cc: Felipa Furnace, MD, <Dictator> Chrishauna Mee Juanda Chance MD ELECTRONICALLY SIGNED 08/22/2012 13:25

## 2014-11-01 NOTE — H&P (Signed)
PATIENT NAME:  Larry Vincent, Larry Vincent MR#:  [id number] DATE OF BIRTH:  Jun 07, 1966  DATE OF ADMISSION:  08/06/2012  ADMITTING DIAGNOSES:  1. Perianal abscess.  2. Stage IV lung cancer.   HISTORY: This is a 49 year old white male with a history of stage IV lung cancer undergoing treatment [facility] the [facility] who presents to the Emergency Room with a 2-day history of worsening perianal pain and some difficulty walking due to such. He has had no drainage. No fever. In the Emergency Room, examination was concerning for perianal abscess. The patient underwent a CT scan of his abdomen and pelvis which demonstrated the expected amount of fluid in the right perianal region consistent with the exam. There is evidence of metastatic disease which is well known and established. Surgical services were asked to consult.   ALLERGIES: LISINOPRIL.   MEDICATIONS:  1. MS Contin 100 mg by mouth b.i.d.  2. Albuterol.  3. Celexa 20 mg by mouth once a day.  4. Compazine 10 mg by mouth every 6 hours as needed for nausea and vomiting.  5. Dexamethasone 4 mg by mouth b.i.d.  6. Valium 5 mg by mouth every 8 hours as needed.  7. Dilaudid 2 mg of mouth every 4 hours as needed for breakthrough pain.  8. Colace 100 mg by mouth b.i.d.  9. Lasix 20 mg by mouth once a day.  10. Zofran 8 mg by mouth b.i.d. as needed for nausea and vomiting.  11. Seroquel 10 mg by mouth once a day.  12. Tegretol 100 mg by mouth in the morning and 50 mg by mouth in the evening.  13. Ventolin HFA 2 puffs as needed q.6 hours.  14. Vicodin extra strength 7.5/300 one tab by mouth every 4 to 6 hours as needed for pain.   PAST MEDICAL HISTORY: Significant for metastatic lung cancer, cholelithiasis.   PAST SURGICAL HISTORY: Significant for cholecystectomy.   SOCIAL HISTORY: The patient is a former smoker and drinker. He lives alone. He is under the care of hospice.   REVIEW OF SYSTEMS: As described above.   PHYSICAL  EXAMINATION:  GENERAL: The patient is alert and oriented, in obvious discomfort, lying on his right side down hip down.  VITAL SIGNS: Temperature is 96.5, pulse of 94, respiratory rate of 20, blood pressure is 115/68. He is 5 feet 10. BMI 28.7.  LUNGS: Clear.  HEART: Regular rate and rhythm.  ABDOMEN: Demonstrates marked hepatomegaly with consistent metastatic disease to the liver, otherwise unremarkable.  PERIANAL: Demonstrates a 4 to 5 cm area of cellulitis, induration and abscess formation to the right of the perianal region. It is located rather anteriorly. Digital rectal examination was deferred.  EXTREMITIES: Warm and well perfused.  NEUROLOGIC AND PSYCHIATRIC: Normal.   LABORATORY VALUES: WBC is 14.4, hemoglobin 10.9, hematocrit 32.7, platelet count 240,000. Bilirubin 1.7, alkaline phosphatase 666, AST 165, ALT 105. Sodium is 133, chloride 96, potassium 4.1, creatinine 0.91, BUN 22. INR is 1. Urinalysis is negative.   IMPRESSION: A 49 year old white male with perianal abscess in the setting of stage IV lung disease with marked hepatic involvement both by clinical examination and by deranged liver function tests.   PLAN: The patient will be admitted, started on IV Rocephin and Flagyl, warm compresses, sitz baths, pain control. Re-examination in the morning. If things are not improved, he will require operative incision and drainage given the large amount of narcotics he is taking.    ____________________________ Redge GainerMark A. Egbert GaribaldiBird, MD  mab:gb D: 08/06/2012 20:17:51 ET T: 08/06/2012 20:28:55 ET JOB#: [id number]  cc: Larry Leriche A. Egbert Garibaldi, MD, <Dictator> Larry Kemp MD ELECTRONICALLY SIGNED 08/07/2012 6:39
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
## 2014-11-01 NOTE — Discharge Summary (Signed)
PATIENT NAME:  Larry Vincent, Larry Vincent MR#:  [id number] DATE OF BIRTH:  1965/08/04  DATE OF ADMISSION:  08/07/2012 DATE OF DISCHARGE:  08/09/2012  DISCHARGE DIAGNOSES:   1.  Perirectal abscess status post incision and drainage on 08/07/2012.  2.  History of stage IV lung cancer.  3.  History of cholecystectomy.   DISCHARGE MEDICATIONS:   1.  Compazine 10 mg p.o. q. 6 hours p.r.n. nausea and vomiting.  2.  Seroquel 100 mg p.o. daily.  3.  Dexamethasone 4 mg p.o. b.i.d.  4.  Colace 100 mg p.o. b.i.d.  5.  Celexa 20 mg p.o. daily.  6.  Lasix 20 mg p.o. daily.  7.  Albuterol inhaler 3 mL p.r.n. wheezing.  8.  Diazepam 5 mg p.o. t.i.d. p.r.n. anxiety.  9.  Zofran 8 mg p.o. b.i.d. p.r.n. nausea and vomiting.  10.  Tegretol 100 mg p.o. q.a.m. and 50 mg p.o. at bedtime.  11.  Ventolin 2 puffs inhaled 4 times a day p.r.n. shortness of breath.  12.  Morphine 100 mg p.o. q. 8 hours p.r.n. scheduled.  13.  Acetaminophen/hydrocodone 325/7.5 mg 1 tablet p.o. q. 4 to 6 hours p.r.n. pain.  14.  Ambien 10 mg p.o. at bedtime p.r.n. amnesia.  15.  Bactrim double strength 1 tab p.o. q. 12 hours x 5 days.   PROCEDURE PERFORMED:  Incision and drainage of perianal abscess.   CONSULTATIONS:  Palliative care.   INDICATION FOR ADMISSION:  The patient is a pleasant 49 year old male with stage IV lung cancer who presents with perianal abscess. He was admitted for antibiotics and incision and drainage of abscess.   HOSPITAL COURSE:  The patient was admitted and on the following morning his perianal abscess did not improve and this brought him to the operating room for incision and drainage of abscess. Postoperatively, he had significant pain control issues and is on chronic narcotics so therefore palliative care was consulted. Throughout the hospital course, his diet was advanced. He was begun on an increased pain regimen and at the time of discharge he had good p.o. pain control and was voiding and stooling without  difficulties.   DISCHARGE INSTRUCTIONS:  The patient is to call or return to the ED if he has increased pain, nausea, vomiting, redness or drainage from incision. He does have a Penrose and this will be removed in approximately 1 week and I will assist him with healing of this wound.    ____________________________ Si Raiderhristopher A. Koleman Marling, MD cal:si D: 08/09/2012 18:16:36 ET T: 08/09/2012 20:14:57 ET JOB#: [id number]  cc: Cristal Deerhristopher A. Rondarius Kadrmas, MD, <Dictator> Jarvis NewcomerHRISTOPHER A Cylas Falzone MD ELECTRONICALLY SIGNED 08/12/2012 13:19

## 2014-11-01 NOTE — Consult Note (Signed)
PATIENT NAME:  Larry Vincent, Larry Vincent MR#:  [id number] DATE OF BIRTH:  [birth year]  DATE OF CONSULTATION:  08/13/2012  REFERRING PHYSICIAN:   CONSULTING PHYSICIAN:  Ivette Castronova K. Guss Bundehalla, MD  PLACE OF CONSULTATION: [facility], [place], [place], [address].   AGE: 49 years.   SEX: Male.   RACE: White.   SUBJECTIVE: The patient was asked to be seen for terminal care cancer and depression/bipolar. The patient is a 49 year old white male who appears a lot older than stated age. He has been divorced for many years and has no children. 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 The patient was seen in the room, and one of his good friends was visiting. The patient reports that he lives by himself in an apartment, and his uncle visits him as much as he can and uncle has full-time job. The patient reports that he is compliant with medications and takes them as prescribed. He reports that he takes care of his food and he takes a cab to go buy food whenever he can. The patient has been diagnosed with terminal care cancer, level IV lung cancer. He is in terminal stage and hospice request has been made for help as he needs the same.   OBJECTIVE: Wearing hospital clothes. The patient is seen lying in bed. Appears to have difficulty breathing, and he appears a lot older than his age and appears to be in distress. He is alert and oriented, pleasant and cooperative. Affect is flat. Mood depressed. He does feel low and run down. He feels hopeless and helpless but denies feeling worthless and useless. Denies suicidal or homicidal ideas or plans and wants to get help. Thought processes are logical and goal directed. Insight and judgment are fair, guarded.   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   RECOMMENDATION: Increase his Celexa to 40 mg p.o. daily for better control of his depression. Hopefully, hospice will accept the patient because he does need help from  hospice at this time.     ____________________________ Jannet MantisSurya K. Guss Bundehalla, MD skc:gb D: 08/13/2012 17:35:00 ET T: 08/14/2012 04:56:50 ET JOB#: [id number]  cc: Monika SalkSurya K. Guss Bundehalla, MD, <Dictator> Beau FannySURYA K Chantay Whitelock MD ELECTRONICALLY SIGNED 08/14/2012 15:59

## 2014-11-01 NOTE — Op Note (Signed)
PATIENT NAME:  Larry Vincent, Larry Vincent MR#:  [id number] DATE OF BIRTH:  Sep 29, 1965  DATE OF PROCEDURE:  08/07/2012  PREOPERATIVE DIAGNOSIS: Right perianal abscess.   POSTOPERATIVE DIAGNOSIS: Right perianal abscess.  PROCEDURE PERFORMED:  Incision and drainage of right perianal abscess with placement of Penrose drain.   SURGEON: Ida Roguehristopher  Randon Somera, MD   ANESTHESIA: LMA and MAC and 1% lidocaine with epinephrine.   ESTIMATED BLOOD LOSS: 10 mL.  SPECIMEN: Culture of abscess contents for Gram stain culture and sensitivity.   INDICATION FOR SURGERY: The patient is a pleasant 49 year old male who has had a few days of right perianal pain and swelling. He was felt to have a perianal abscess on physical exam and, thus, was brought to the operating room for incision and drainage of perianal abscess.   DETAILS OF PROCEDURE: The patient was brought to the operating room. He was induced, and LMA was placed, and general anesthesia was administered. His legs were then placed up into stirrups, and his perianal area was prepped and draped in standard surgical fashion. A timeout was then performed correctly identifying the patient name, operative site, and procedure to be performed. Approximately 10 mL of 1% lidocaine with epinephrine was injected into the subcutaneous tissue surrounding the abscess which appeared to be about 5 x 2 cm. I then placed a sterile 15 blade into the cavity and purulence was obtained. I then made a distal incision and then opened up the cavity, broke all loculations, and then placed a large Penrose drain which I sutured in place to the skin edges. I then irrigated the cavity and then packed the cavity with Kerlix gauze with Betadine. His legs were then taken down and placed on the bed.     He was then awoken. LMA was removed, and he was brought to the postanesthesia care unit. There were no immediate complications. Needle, sponge, and instrument counts were correct at the end of the  procedure.   ____________________________ Si Raiderhristopher A. Ashely Joshua, MD cal:cb D: 08/07/2012 10:24:06 ET T: 08/07/2012 10:41:56 ET JOB#: [id number]  cc: Cristal Deerhristopher A. Peaches Vanoverbeke, MD, <Dictator> Jarvis NewcomerHRISTOPHER A Bobbijo Holst MD ELECTRONICALLY SIGNED 08/12/2012 13:19
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
## 2014-11-03 NOTE — Op Note (Signed)
PATIENT NAME:  Larry Vincent, Larry Vincent MR#:  [id number] DATE OF BIRTH:  1966-01-31  DATE OF PROCEDURE:  12/27/2011  PREOPERATIVE DIAGNOSIS: Lung cancer.   POSTOPERATIVE DIAGNOSIS: Lung cancer.   OPERATION PERFORMED: Insertion of right internal jugular Port-A-Cath.   SURGEON: Sylis Ketchum E. Genevive Bi, MD  ASSISTANT: None.   INDICATIONS FOR PROCEDURE: [name] is a 49 year old gentleman who was recently discovered to have metastatic small cell carcinoma. He was offered the above-named procedure for intravenous access for chemotherapy. The indications and risks were explained to the patient who gave his informed consent. Of note is that on the patient's preoperative examination prior to surgery today it was noted that he had fallen yesterday. I met with the patient preoperatively and discussed what had happened. Apparently the patient had stood up rather quickly from a seated position, took several steps and then fell. He did suffer some abrasions around his face and nose. He states he may have lost consciousness for up to two minutes but did not go to the Emergency Room and felt fine afterwards. On my neurologic examination I notice no acute findings.   DESCRIPTION OF PROCEDURE: The patient was brought to the operating suite and placed in the supine position. Laryngeal mask airway anesthesia was established. The patient was prepped and draped in the usual sterile fashion. Using the ultrasound device we identified the right internal jugular vein and then percutaneously catheterized this. A wire was inserted through the needle into the right side of the heart and verified under fluoroscopy. A skin wheal was raised on the anterior surface of the chest wall and a subcutaneous tunnel was created. A port site was also created. The Port-A-Cath was then tunneled from the anterior chest wall up to the internal jugular vein puncture site. The peel-away sheath was then placed over the wire and the Port-A-Cath was inserted into  the right side of the heart. Under fluoroscopic guidance it was pulled back from the right ventricle into the superior vena cava/right atrial junction. The catheter flushed nicely. It was then trimmed and secured to the anterior chest wall with interrupted 3-0 Prolene on the corners. The catheter again flushed nicely. Fluoroscopy confirmed the presence of the catheter in the distal superior vena cava. There is no pneumothorax or pleural effusion. The wounds were then copiously irrigated and closed in multiple layers. Nylon was used on the skin. Subcutaneous tissues had been closed with Vicryl. Sterile dressings were applied. The patient was awakened from laryngeal mask airway anesthesia and taken to the recovery room in stable condition.   ____________________________ Lew Dawes Genevive Bi, MD teo:cms D: 12/27/2011 17:27:33 ET T: 12/27/2011 17:34:10 ET JOB#: [id number]  cc: Christia Reading E. Genevive Bi, MD, <Dictator> Kathlene November. Grayland Ormond, MD Louis Matte MD ELECTRONICALLY SIGNED 12/30/2011 14:32

## 2014-11-03 NOTE — Discharge Summary (Signed)
PATIENT NAME:  Larry Vincent, Larry Vincent MR#:  [id number] DATE OF BIRTH:  April 01, 1966  DATE OF ADMISSION:  12/08/2011 DATE OF DISCHARGE:  12/12/2011  DISCHARGE DIAGNOSES:  1. Chronic obstructive pulmonary disease exacerbation. 2. Pneumonia.  3. Metastatic lung cancer. 4. Pleuritic chest pain. 5. Depression.  DISCHARGE MEDICATIONS: 1. Ventolin two puffs four times daily as needed. 2. Tegretol XL one tablet p.o. twice a day, one in the morning and half at night. 3. Celexa 20 mg p.o. daily.  4. Seroquel 100 mg at bedtime. 5. Levaquin 500 mg p.o. daily for four days. 6. Advair Diskus 250/50 twice a day.  CONSULTANTS:  1. Caryn SectionAarti Kapur, MD - Psychiatry. 2. Ned GraceNancy Phifer, MD - Palliative Care.  3. Gerarda Fractionimothy Finnegan, MD - Oncology.  LABS/STUDIES: Blood cultures have been negative.   Chest x-ray on admission showed there is increased density at the right base concerning for pneumonia. Hilar and mediastinal adenopathy on the right. Heart is enlarged.   Electrolytes on admission: Sodium 141, potassium 4.2, chloride 106, bicarbonate 20, BUN 15, creatinine 1.05, and glucose 114.    CT of the chest was done with contrast which showed metastatic disease throughout the liver. No evidence of PE. The patient has development of infiltrate within the right lung base and very likely component of interval development of atelectasis. Mediastinal mass encasing the right mainstem bronchus and there is narrowing of the bronchus and right upper lobe bronchus intermedius and right lower lobe bronchi compatible with atelectasis and post-obstructive pneumonitis.   Troponin less than 0.02.  Folic acid 9.5. B12 289.   Kidney function normal. CBC on 12/09/2011 WBC 26.5, hemoglobin 13.8, hematocrit 41.7, and platelets 262.  WBC improved and normalized on 12/11/2011 to 9.5, hemoglobin 14.4, hematocrit 42.4, and platelets 278.   DISCHARGE VITALS: Temperature 98.6, pulse 96, respirations 20, blood pressure 123/75, and saturation  96% on room air.   HOSPITAL COURSE:  1. The patient is a 49 year old patient with history of metastatic lung cancer who came in because of shortness of breath and also had pleuritic chest pain. Chest x-ray showed possible pneumonia and WBC was elevated. He was admitted for COPD exacerbation and pneumonia and was started on IV Levaquin along with steroids and nebulizers. The patient's chest pain was thought to be secondary to pleural effusion and lung cancer. Cardiac markers have been negative. The patient did have considerable improvement in his breathing and we discharged him with p.o. antibiotics and steroids and he can continue his inhalers and can see Dr. Orlie DakinFinnegan for his first round of chemotherapy.  2. Depression. The patient was seen by Dr. Caryn SectionAarti Kapur. The patient has frequent crying spells and history of suicidal ideation. He also has previous hospitalization for depression. Dr. Maryruth BunKapur placed him on one-to-one observation because the patient is endorsed that he had some prior suicidal thoughts and he was very upset because he could not see his mother who lives in [place]. So the patient was started on Celexa and Seroquel and  but over the weekend he denied any complaints and not suicidal or homicidal. Celexa and Seroquel were working well and Dr. Margarita RanaSurya Challa, who was on call, psychiatrist, recommended one to one sitter and psychiatry cleared the patient for discharge so I discharged the patient home on the above antibiotics and steroids and Celexa and Seroquel. He is to followup with them in one to two weeks.         3. Regarding lung cancer, the patient was seen by Dr. Orlie DakinFinnegan. The patient  does have an appointment to follow up with him on 12/14/2011. The patient is discharged home in stable condition.    TIME SPENT ON DISCHARGE PREPARATION: More than 30 minutes  ____________________________ Katha Hamming, MD sk:slb D: 12/13/2011 23:04:38 ET T: 12/14/2011 11:39:55  ET JOB#: [id number]  cc: Katha Hamming, MD, <Dictator> Katha Hamming MD ELECTRONICALLY SIGNED 12/20/2011 14:01

## 2014-11-03 NOTE — H&P (Signed)
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 Further work-up revealed small cell carcinoma. He was supposed to have a Port-A-Cath placed tomorrow. However, since 7:30 a.m. yesterday the patient has had fairly acute onset of right-sided pleuritic chest pain as well as shortness of breath. The patient is currently wheezing across the room and appears to be in respiratory distress. The patient otherwise was found to be hemodynamically stable in the ED. The patient denies any recent seizures. He has been able to fill his prescriptions for his usual medications. He denies any fever or chills, rigors or cough. He denies any recent weight loss or weight gain.   PAST MEDICAL HISTORY:  1. History of head injury in 1971, on seizure prophylaxis since then.  2. History of anxiety and depression.  3. Hypertension.  4. Small cell lung cancer.   PAST SURGICAL HISTORY: None.   SOCIAL HISTORY: Divorced. Used to smoke 2 packs of cigarettes a day and drinks 3 to 4 alcoholic drinks per week.   FAMILY HISTORY: Positive family history of lung cancer in the mother and colon cancer in his mother. She also has diabetes and hypertension, also has heart disease and history of stroke. He currently states that his mother recently had bypass surgery as well.   REVIEW OF SYSTEMS: CONSTITUTIONAL: Denies any fever but complaining of fatigue, weakness, and pain. EYES: Denies blurry or double vision. Denies glaucoma or cataracts. ENT: Denies tinnitus, ear pain, hearing loss, seasonal allergies, or epistaxis. RESPIRATORY: Has a cough associated with the wheeze. No hemoptysis. Does have dyspnea and difficulty breathing.  CARDIOVASCULAR: Right-sided chest pain. No orthopnea. No arrhythmia. No dyspnea on exertion, palpitations. GI: No nausea, vomiting, abdominal pain, hematemesis, melena. GU: No dysuria, hematuria, renal calculi, incontinence. ENDOCRINE: No polyuria, nocturia, thyroid problem, or increased sweating. HEME: No anemia, easy bruising, bleeding, or swollen glands. MUSCULOSKELETAL: No pain. No arthritis, cramps, or swelling of the glands. NEUROLOGIC: No numbness, weakness, dysarthria, epilepsy, tremors, vertigo, ataxia. PSYCHIATRIC: Positive for anxiety but no insomnia or bipolar disorder. Positive for depression.   ALLERGIES: No known drug allergies.   HOME MEDICATIONS:  1. Ventolin inhaled 4 times a day.  2. Lisinopril 20 mg p.o. daily.  3. Tegretol 25 mg twice a day.   PHYSICAL EXAMINATION:   VITAL SIGNS: Temperature 100.2, pulse 106, respirations 14, blood pressure 123/71, 94% on room air.   GENERAL: Currently not comfortable, appears to be in mild to moderate respiratory distress.   HEENT: Pupils equal and reactive. Extraocular movements intact.   NECK: Supple. No JVD.   LUNGS: Bibasilar wheezing and rhonchi at the bases.   CARDIOVASCULAR: Regular rate and rhythm. No murmurs, rubs, or gallops.   ABDOMEN: Obese, soft, nontender, nondistended. Normoactive bowel sounds.   EXTREMITIES: Without any cyanosis, clubbing, or edema.   MUSCULOSKELETAL: No obvious swollen joints. The patient can move all four extremities spontaneously.   NEUROLOGIC: Cranial nerves II through XII grossly intact. Motor strength intact in bilateral upper and lower extremities. Deep tendon reflexes 2+ bilaterally. Babinski negative.   PSYCHIATRIC: Appears to be very anxious.   SKIN: Without any skin rashes.   LYMPHATICS: No evidence of axillary or cervical lymphadenopathy.  LABORATORY, DIAGNOSTIC, AND RADIOLOGICAL DATA: EKG shows normal sinus rhythm, incomplete right bundle branch block.  Chest x-ray, PA and  lateral, shows interstitial markings in both lungs suggestive of interstitial edema of cardiac or noncardiac cause.   CT scan of the chest shows right-sided pleural effusion.   WBC 15.7, hemoglobin 15.0, hematocrit 44.0, platelet count 270, glucose 114, BUN 15, creatinine 1.05, sodium 141, potassium 4.2, chloride 24, calcium 8.7, AST 29, AST 27. INR 0.9.   ASSESSMENT AND PLAN:  1. Shortness of breath secondary to COPD exacerbation/right pleural effusion. The patient may need a therapeutic thoracentesis in the morning because of his shortness of breath which may improve after the fluid is drained. Most likely this is a parapneumonic versus a malignant effusion. For his shortness of breath, we will treat him for COPD exacerbation. Given his low-grade fever and white count we will start him on Levaquin. Also start him on high dose IV steroids. DuoNeb treatments.  2. Chest pain likely secondary to the pleural effusion and lung cancer. Probably noncardiac. The patient will be admitted to stepdown unit. We will cycle his cardiac enzymes and start him on aspirin, beta-blocker, as well as nitro paste.   3. Small cell lung cancer. Dr. Orlie DakinFinnegan will be consulted in the morning. He will probably need to have a therapeutic thoracentesis done in the morning if okay with Dr. Orlie DakinFinnegan. The patient will also need a Port-A-Cath placement during this admission.   CODE STATUS: He is a FULL CODE.        TIME SPENT: About 70 minutes was spent in coordinating care of this patient as well as discussing clinical course.   ____________________________ Richarda OverlieNayana Toddrick Sanna, MD na:drc D: 12/08/2011 01:09:47 ET T: 12/08/2011 07:12:41 ET JOB#: [id number]  cc: Richarda OverlieNayana Nayali Talerico, MD, <Dictator> Tollie Pizzaimothy J. Orlie DakinFinnegan, MD Richarda OverlieNAYANA Romaine Neville MD ELECTRONICALLY SIGNED 12/22/2011 0:14

## 2014-11-03 NOTE — Consult Note (Signed)
History of Present Illness:   Reason for Consult Stage IV small cell lung carcinoma, COPD exacerbation.    HPI   Patient last seen in clinic one week ago for treatment planning for his status for small cell lung cancer which will begin on Tuesday, December 14, 2011.  Patient states yesterday he had increasing shortness of breath as well as chest pain at which point he called 911 and was admitted through the ER with a COPD exacerbation and possible underlying pneumonia.  He continues to be highly anxious secondary to a significant amount of social issues, but otherwise feels well. He has no neurologic complaints. He denies any recent fevers.  He has a good appetite and denies weight loss.  He has no chest pain or shortness of breath.  He denies any nausea, vomiting, constipation, or diarrhea.  He has no melena or hematochezia.  He has no urinary complaints.  Patient offers no further specific complaints today.  PFSH:   Additional Past Medical and Surgical History Past medical history: Seizure disorder, hypertension.  Past surgical history: Patient states he had brain surgery as a child from trauma.  Family history: Breast cancer, kidney cancer, lung cancer, prostate cancer.  Diabetes, CAD  Social history: Positive tobacco, one half pack per day x30 years.  Occasional alcohol.   Review of Systems:   Performance Status (ECOG) 0    Review of Systems   As per HPI. Otherwise, 10 point system review was negative.   NURSING NOTES: **Vital Signs.:   29-May-13 11:04    Vital Signs Type: Admission    Temperature Temperature (F): 97.4    Celsius: 36.3    Temperature Source: oral    Pulse Pulse: 93    Pulse source: per Dinamap    Respirations Respirations: 21    Systolic BP Systolic BP: 211    Diastolic BP (mmHg) Diastolic BP (mmHg): 71    Mean BP: 89    BP Source: vital sign device    Pulse Ox % Pulse Ox %: 93    Oxygen Delivery: 3L    Pulse Ox Heart Rate: 94   Physical  Exam:   Physical Exam General: Well-developed, well-nourished, no acute distress. Eyes: Pink conjunctiva, anicteric sclera. HEENT: Normocephalic, moist mucous membranes, clear oropharnyx. Lungs: Clear to auscultation bilaterally. Heart: Regular rate and rhythm. No rubs, murmurs, or gallops. Abdomen: Soft, nontender, nondistended. No organomegaly noted, normoactive bowel sounds. Musculoskeletal: No edema, cyanosis, or clubbing. Neuro: Alert, answering all questions appropriately. Cranial nerves grossly intact. Skin: No rashes or petechiae noted. Psych: Anxious. Lymphatics: No cervical, calvicular, axillary or inguinal LAD.    Lisinopril: Unknown    Ventolin HFA 90 mcg/inh inhalation aerosol: 2  inhaled 4 times a day, Active, 1, 4   lisinopril 20 mg oral tablet: 1 tab(s) orally 2 times a day x 30 days, Active, 60, None   Tegretol XR 100 mg oral tablet, extended release: 1 tab(s) orally 2 times a day-1 tab in am and 1/2 in pm, Active, 60, None   multivitamin: 1 tab(s) orally once a day, Active, 0, None  Routine Hem:  28-May-13 21:09    WBC (CBC) 15.7   RBC (CBC) 4.70   Hemoglobin (CBC) 15.0   Hematocrit (CBC) 44.0   Platelet Count (CBC) 270   MCV 94   MCH 32.0   MCHC 34.2   RDW 12.8  Routine Chem:  28-May-13 21:09    Glucose, Serum 114   BUN 15   Creatinine (comp) 1.05  Sodium, Serum 141   Potassium, Serum 4.2   Chloride, Serum 106   CO2, Serum 24   Calcium (Total), Serum 8.7  Hepatic:  28-May-13 21:09    Bilirubin, Total 0.4   Alkaline Phosphatase 105   SGPT (ALT) 29   SGOT (AST) 27   Total Protein, Serum 7.5   Albumin, Serum 3.3  Routine Chem:  28-May-13 21:09    Osmolality (calc) 283   eGFR (African American) >60   eGFR (Non-African American) >60   Anion Gap 11  Routine Coag:  28-May-13 21:09    Prothrombin 12.7   INR 0.9   Activated PTT (APTT) 26.7   Assessment and Plan:  Impression:   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  Plan:   1.  Small cell carcinoma: Patient was initially planned have a port placed this morning, but this will be delayed 1-2 days until his acute symptoms resolve.  Patient has been instructed to keep his previously scheduled followup appointment on December 14, 2011 to initiate cycle 1 of his chemotherapy using cisplatin and etoposide.  Given patient's multiple social issues and high anxiety, have also ordered a palliative care consult for assistance. consult, will follow.  Electronic Signatures: Delight Hoh (MD)  (Signed [phone] 12:45)  Authored: HISTORY OF PRESENT ILLNESS, PFSH, ROS, NURSING NOTES, PE, ALLERGIES, HOME MEDICATIONS, LABS, ASSESSMENT AND PLAN   Last Updated: 29-May-13 12:45 by Delight Hoh (MD)
